# Patient Record
Sex: Male | Born: 1955
Health system: Southern US, Community
[De-identification: ages and names within clinical notes are randomized; demographics above are authoritative.]

## PROBLEM LIST (undated history)

## (undated) DIAGNOSIS — F32A Depression, unspecified: Secondary | ICD-10-CM

## (undated) DIAGNOSIS — E039 Hypothyroidism, unspecified: Secondary | ICD-10-CM

## (undated) DIAGNOSIS — E785 Hyperlipidemia, unspecified: Secondary | ICD-10-CM

## (undated) DIAGNOSIS — F329 Major depressive disorder, single episode, unspecified: Secondary | ICD-10-CM

## (undated) DIAGNOSIS — C73 Malignant neoplasm of thyroid gland: Secondary | ICD-10-CM

## (undated) DIAGNOSIS — I82629 Acute embolism and thrombosis of deep veins of unspecified upper extremity: Secondary | ICD-10-CM

## (undated) DIAGNOSIS — J301 Allergic rhinitis due to pollen: Secondary | ICD-10-CM

## (undated) HISTORY — DX: Hypothyroidism, unspecified: E03.9

## (undated) HISTORY — DX: Malignant neoplasm of thyroid gland: C73

## (undated) HISTORY — DX: Hyperlipidemia, unspecified: E78.5

## (undated) HISTORY — PX: THYROIDECTOMY: SHX17

## (undated) HISTORY — DX: Acute embolism and thrombosis of deep veins of unspecified upper extremity: I82.629

## (undated) HISTORY — DX: Allergic rhinitis due to pollen: J30.1

## (undated) HISTORY — DX: Depression, unspecified: F32.A

## (undated) HISTORY — PX: COLONOSCOPY: SHX174

## (undated) HISTORY — DX: Major depressive disorder, single episode, unspecified: F32.9

---

## 1998-01-09 ENCOUNTER — Ambulatory Visit (HOSPITAL_COMMUNITY): Admission: RE | Admit: 1998-01-09 | Discharge: 1998-01-09 | Payer: Self-pay | Admitting: Family Medicine

## 1998-01-31 ENCOUNTER — Other Ambulatory Visit: Admission: RE | Admit: 1998-01-31 | Discharge: 1998-01-31 | Payer: Self-pay | Admitting: *Deleted

## 1998-03-28 ENCOUNTER — Encounter: Payer: Self-pay | Admitting: Surgery

## 1998-03-29 ENCOUNTER — Ambulatory Visit (HOSPITAL_COMMUNITY): Admission: RE | Admit: 1998-03-29 | Discharge: 1998-03-30 | Payer: Self-pay | Admitting: Surgery

## 1998-05-03 ENCOUNTER — Ambulatory Visit (HOSPITAL_COMMUNITY): Admission: RE | Admit: 1998-05-03 | Discharge: 1998-05-04 | Payer: Self-pay | Admitting: Surgery

## 1998-05-25 ENCOUNTER — Ambulatory Visit (HOSPITAL_COMMUNITY): Admission: RE | Admit: 1998-05-25 | Discharge: 1998-05-25 | Payer: Self-pay | Admitting: *Deleted

## 1998-05-31 ENCOUNTER — Ambulatory Visit (HOSPITAL_COMMUNITY): Admission: RE | Admit: 1998-05-31 | Discharge: 1998-05-31 | Payer: Self-pay | Admitting: *Deleted

## 1999-05-13 ENCOUNTER — Encounter (HOSPITAL_COMMUNITY): Admission: RE | Admit: 1999-05-13 | Discharge: 1999-08-11 | Payer: Self-pay | Admitting: *Deleted

## 2005-05-28 ENCOUNTER — Encounter: Payer: Self-pay | Admitting: Family Medicine

## 2006-06-29 ENCOUNTER — Ambulatory Visit: Payer: Self-pay | Admitting: Gastroenterology

## 2006-07-15 ENCOUNTER — Ambulatory Visit: Payer: Self-pay | Admitting: Gastroenterology

## 2007-12-02 ENCOUNTER — Encounter: Payer: Self-pay | Admitting: Family Medicine

## 2008-06-27 ENCOUNTER — Ambulatory Visit: Payer: Self-pay | Admitting: Family Medicine

## 2008-06-27 DIAGNOSIS — E785 Hyperlipidemia, unspecified: Secondary | ICD-10-CM

## 2008-06-27 DIAGNOSIS — E039 Hypothyroidism, unspecified: Secondary | ICD-10-CM

## 2008-06-27 DIAGNOSIS — F329 Major depressive disorder, single episode, unspecified: Secondary | ICD-10-CM

## 2008-06-28 LAB — CONVERTED CEMR LAB
ALT: 21 units/L (ref 0–53)
AST: 21 units/L (ref 0–37)
Albumin: 4.6 g/dL (ref 3.5–5.2)
Alkaline Phosphatase: 61 units/L (ref 39–117)
Bilirubin, Direct: 0.1 mg/dL (ref 0.0–0.3)
Cholesterol: 167 mg/dL (ref 0–200)
HDL: 54 mg/dL (ref >39.00)
LDL Cholesterol: 98 mg/dL (ref 0–99)
TSH: 0.76 microintl units/mL (ref 0.35–5.50)
Total Bilirubin: 1.1 mg/dL (ref 0.3–1.2)
Total CHOL/HDL Ratio: 3
Total Protein: 7.8 g/dL (ref 6.0–8.3)
Triglycerides: 76 mg/dL (ref 0.0–149.0)
VLDL: 15.2 mg/dL (ref 0.0–40.0)

## 2009-03-27 DIAGNOSIS — I82629 Acute embolism and thrombosis of deep veins of unspecified upper extremity: Secondary | ICD-10-CM

## 2009-03-27 HISTORY — DX: Acute embolism and thrombosis of deep veins of unspecified upper extremity: I82.629

## 2009-04-24 ENCOUNTER — Ambulatory Visit: Payer: Self-pay

## 2009-04-24 ENCOUNTER — Ambulatory Visit: Payer: Self-pay | Admitting: Family Medicine

## 2009-04-24 DIAGNOSIS — N529 Male erectile dysfunction, unspecified: Secondary | ICD-10-CM

## 2009-04-24 DIAGNOSIS — I82409 Acute embolism and thrombosis of unspecified deep veins of unspecified lower extremity: Secondary | ICD-10-CM | POA: Insufficient documentation

## 2009-04-24 DIAGNOSIS — M7989 Other specified soft tissue disorders: Secondary | ICD-10-CM

## 2009-04-27 ENCOUNTER — Ambulatory Visit: Payer: Self-pay | Admitting: Family Medicine

## 2009-04-27 LAB — CONVERTED CEMR LAB
INR: 1.3
Prothrombin Time: 14 s

## 2009-04-30 ENCOUNTER — Ambulatory Visit: Payer: Self-pay | Admitting: Family Medicine

## 2009-04-30 LAB — CONVERTED CEMR LAB
ALT: 20 units/L (ref 0–53)
Basophils Absolute: 0.1 10*3/uL (ref 0.0–0.1)
Basophils Relative: 1.1 % (ref 0.0–3.0)
Calcium: 9.5 mg/dL (ref 8.4–10.5)
Cholesterol: 178 mg/dL (ref 0–200)
Eosinophils Relative: 2.4 % (ref 0.0–5.0)
GFR calc non Af Amer: 106.53 mL/min (ref >60)
HCT: 43.5 % (ref 39.0–52.0)
HDL: 49.1 mg/dL (ref >39.00)
Hemoglobin: 15.1 g/dL (ref 13.0–17.0)
LDL Cholesterol: 108 mg/dL — ABNORMAL HIGH (ref 0–99)
Lymphocytes Relative: 38.3 % (ref 12.0–46.0)
Monocytes Relative: 9.5 % (ref 3.0–12.0)
Neutro Abs: 3.3 10*3/uL (ref 1.4–7.7)
Potassium: 4.6 meq/L (ref 3.5–5.1)
RBC: 4.53 M/uL (ref 4.22–5.81)
Sodium: 137 meq/L (ref 135–145)
TSH: 1.51 microintl units/mL (ref 0.35–5.50)
Total Bilirubin: 0.7 mg/dL (ref 0.3–1.2)
Total Protein: 7.7 g/dL (ref 6.0–8.3)
VLDL: 21.2 mg/dL (ref 0.0–40.0)
WBC: 6.7 10*3/uL (ref 4.5–10.5)

## 2009-05-02 ENCOUNTER — Ambulatory Visit: Payer: Self-pay | Admitting: Family Medicine

## 2009-05-02 LAB — CONVERTED CEMR LAB
INR: 2.5
Prothrombin Time: 19.3 s

## 2009-05-16 ENCOUNTER — Ambulatory Visit: Payer: Self-pay | Admitting: Family Medicine

## 2009-05-16 LAB — CONVERTED CEMR LAB
INR: 3.3
Prothrombin Time: 21.8 s

## 2009-05-30 ENCOUNTER — Ambulatory Visit: Payer: Self-pay | Admitting: Family Medicine

## 2009-05-30 LAB — CONVERTED CEMR LAB: Prothrombin Time: 17.9 s

## 2009-06-18 ENCOUNTER — Telehealth: Payer: Self-pay | Admitting: Family Medicine

## 2009-06-27 ENCOUNTER — Ambulatory Visit: Payer: Self-pay | Admitting: Family Medicine

## 2009-07-25 ENCOUNTER — Ambulatory Visit: Payer: Self-pay | Admitting: Family Medicine

## 2009-08-22 ENCOUNTER — Ambulatory Visit: Payer: Self-pay | Admitting: Family Medicine

## 2009-08-22 LAB — CONVERTED CEMR LAB: INR: 2.3

## 2009-09-06 ENCOUNTER — Telehealth: Payer: Self-pay | Admitting: Family Medicine

## 2009-09-26 ENCOUNTER — Ambulatory Visit: Payer: Self-pay | Admitting: Family Medicine

## 2009-10-10 ENCOUNTER — Ambulatory Visit: Payer: Self-pay | Admitting: Family Medicine

## 2009-11-07 ENCOUNTER — Ambulatory Visit: Payer: Self-pay | Admitting: Family Medicine

## 2009-11-07 LAB — CONVERTED CEMR LAB: INR: 2.3

## 2009-12-05 ENCOUNTER — Ambulatory Visit: Payer: Self-pay | Admitting: Family Medicine

## 2009-12-11 ENCOUNTER — Encounter: Payer: Self-pay | Admitting: Family Medicine

## 2009-12-12 ENCOUNTER — Encounter: Payer: Self-pay | Admitting: Family Medicine

## 2009-12-12 ENCOUNTER — Ambulatory Visit: Payer: Self-pay

## 2009-12-17 ENCOUNTER — Telehealth: Payer: Self-pay | Admitting: Family Medicine

## 2010-01-03 ENCOUNTER — Ambulatory Visit: Payer: Self-pay | Admitting: Family Medicine

## 2010-01-03 DIAGNOSIS — L919 Hypertrophic disorder of the skin, unspecified: Secondary | ICD-10-CM

## 2010-01-03 DIAGNOSIS — L851 Acquired keratosis [keratoderma] palmaris et plantaris: Secondary | ICD-10-CM

## 2010-01-03 DIAGNOSIS — L909 Atrophic disorder of skin, unspecified: Secondary | ICD-10-CM | POA: Insufficient documentation

## 2010-01-09 LAB — CONVERTED CEMR LAB
AST: 19 units/L (ref 0–37)
Alkaline Phosphatase: 60 units/L (ref 39–117)
Basophils Absolute: 0 10*3/uL (ref 0.0–0.1)
Calcium: 9.7 mg/dL (ref 8.4–10.5)
Cholesterol: 200 mg/dL (ref 0–200)
GFR calc non Af Amer: 107.81 mL/min (ref >60.00)
Glucose, Bld: 83 mg/dL (ref 70–99)
HDL: 53.3 mg/dL (ref >39.00)
Hemoglobin: 14.7 g/dL (ref 13.0–17.0)
LDL Cholesterol: 122 mg/dL — ABNORMAL HIGH (ref 0–99)
Lymphocytes Relative: 28 % (ref 12.0–46.0)
Monocytes Relative: 6.8 % (ref 3.0–12.0)
Neutro Abs: 5.3 10*3/uL (ref 1.4–7.7)
Platelets: 256 10*3/uL (ref 150.0–400.0)
RDW: 13.2 % (ref 11.5–14.6)
Sodium: 139 meq/L (ref 135–145)
Total Bilirubin: 0.8 mg/dL (ref 0.3–1.2)
Triglycerides: 124 mg/dL (ref 0.0–149.0)
VLDL: 24.8 mg/dL (ref 0.0–40.0)

## 2010-01-22 ENCOUNTER — Telehealth: Payer: Self-pay | Admitting: Family Medicine

## 2010-02-09 ENCOUNTER — Encounter: Payer: Self-pay | Admitting: Family Medicine

## 2010-02-09 DIAGNOSIS — I82409 Acute embolism and thrombosis of unspecified deep veins of unspecified lower extremity: Secondary | ICD-10-CM

## 2010-02-24 LAB — CONVERTED CEMR LAB
Cholesterol, target level: 200 mg/dL
HDL goal, serum: 40 mg/dL
LDL Goal: 160 mg/dL

## 2010-02-26 NOTE — Assessment & Plan Note (Signed)
Summary: L ARM SWELLING / CONTUSION // RS   Vital Signs:  Patient profile:   55 year old male Weight:      182 pounds BMI:     25.84 Temp:     98.8 degrees F oral BP sitting:   122 / 84  (right arm) Cuff size:   regular  Vitals Entered By: Sid Falcon LPN (April 24, 2009 9:55 AM) CC: left arm swelling noted yesterday, , Lipid Management   History of Present Illness: Acute 1 day hx of edema LUE with some color changes.  No significant arm pain.   No specific injury but did start some upper body weight lifting about one month ago.  No chest pain, pleuritic pain, dyspnea, or hemoptysis.  No hx of thrombosis or known FH of coagulopathy.  Nonsmoker.  Remote hx thyroid cancer.  No recent appetite or weight change.  Edema worse with dependent position.  No recent or remote hx of any bleeding problems.  Couple of weeks ago noted some "bruising" L lateral arm and after pointing out this area has some superficial dilated varicosities which are recently of prominence but no visible bruising.  Other issue is erectile dysfuntion. Has used Cialis in past with good success and requests refills.  No hx CAD and no nitroglycerin use.  Other chronic problems include hx hyperlipidemia and hypothroidism.  Compliant with meds  Lipid Management History:      Positive NCEP/ATP III risk factors include male age 6 years old or older.  Negative NCEP/ATP III risk factors include non-diabetic, no family history for ischemic heart disease, non-tobacco-user status, non-hypertensive, no ASHD (atherosclerotic heart disease), no prior stroke/TIA, no peripheral vascular disease, and no history of aortic aneurysm.     Preventive Screening-Counseling & Management  Alcohol-Tobacco     Smoking Status: quit  Allergies: 1)  ! Vytorin (Ezetimibe-Simvastatin)  Past History:  Past Medical History: Last updated: 06/27/2008 Thyroid cancer Depression Hay fever/allergies  Past Surgical History: Last updated:  06/27/2008 Thyroid removal 2000  Family History: Last updated: 06/27/2008 colon cancer, blood relative Breast cancer, grandparent Family History High cholesterol, parent, blood relative Heart disease, blood relative Family History Hypertension, parent Stroke, parent Diabetes, parent  Social History: Last updated: 06/27/2008 Retired:  Theatre stage manager Married Smoker, quit 1987 PMH-FH-SH reviewed for relevance  Social History: Smoking Status:  quit  Review of Systems  The patient denies fever, weight loss, weight gain, chest pain, syncope, dyspnea on exertion, prolonged cough, hemoptysis, abdominal pain, anorexia, hoarseness, headaches, melena, hematochezia, severe indigestion/heartburn, hematuria, and muscle weakness.    Physical Exam  General:  Well-developed,well-nourished,in no acute distress; alert,appropriate and cooperative throughout examination Head:  Normocephalic and atraumatic without obvious abnormalities. No apparent alopecia or balding. Neck:  No deformities, masses, or tenderness noted. Lungs:  Normal respiratory effort, chest expands symmetrically. Lungs are clear to auscultation, no crackles or wheezes. Heart:  Normal rate and regular rhythm. S1 and S2 normal without gallop, murmur, click, rub or other extra sounds. Pulses:  R radial normal and L radial normal.   Extremities:  Diffuse swelling LUE with some mild bluish color changes c/w RUE.  Normal pulses and nontender.  No axillary masses palpated.   Neurologic:  No cranial nerve deficits noted. Station and gait are normal. Plantar reflexes are down-going bilaterally. DTRs are symmetrical throughout. Sensory, motor and coordinative functions appear intact. Skin:  color changes LUE vs RUE as noted otherwise no signif abnormality. Cervical Nodes:  No lymphadenopathy noted Axillary Nodes:  No palpable  lymphadenopathy Psych:  normally interactive, good eye contact, not anxious appearing, and not depressed  appearing.     Impression & Recommendations:  Problem # 1:  SWELLING OF LIMB (ICD-729.81) Assessment New  Suspicion high for thrombosis, prob subclavian vein.  Doppler now and go from there. Ultrsound shows thrombosis Prox L subclavian vein with some difficulty determining duration of clot (pt returned to office for further discussio n and management after receiving results). Long discussion with pt regarding options including interventional radiology for consideration of thrombolysis vs conservative route of anticoagulation alone and he prefers the latter which seems reasonable given his lack of pain and ?duration of clot.  Pt instructed in dosing of Lovenox and first dose of 80 mg (1mg /kg for outpatient DVT) given in office and pt tolerated well.   Lovenox 80 mg [place] q 12 hours and initiate Coumadin 5 mg daily and office f/u with INR in 3 days. Over 45 minutes spent with pt assessing, discussing options, and educating regarding anticoagulation use.  Orders: Admin of Therapeutic Inj  intramuscular or subcutaneous (69629)  Problem # 2:  IMPOTENCE OF ORGANIC ORIGIN (ICD-607.84) refill cialis. His updated medication list for this problem includes:    Cialis 20 Mg Tabs (Tadalafil) ..... One by mouth every other day as needed  Problem # 3:  HYPOTHYROIDISM, PRIMARY (ICD-244.9)  His updated medication list for this problem includes:    Synthroid 112 Mcg Tabs (Levothyroxine sodium) ..... Once daily    Liothyronine Sodium 25 Mcg Tabs (Liothyronine sodium) ..... One half daily  Problem # 4:  HYPERLIPIDEMIA (ICD-272.4)  His updated medication list for this problem includes:    Crestor 5 Mg Tabs (Rosuvastatin calcium) ..... Once daily  Complete Medication List: 1)  Sertraline Hcl 50 Mg Tabs (Sertraline hcl) .... One daily 2)  Synthroid 112 Mcg Tabs (Levothyroxine sodium) .... Once daily 3)  Crestor 5 Mg Tabs (Rosuvastatin calcium) .... Once daily 4)  Liothyronine Sodium 25 Mcg Tabs  (Liothyronine sodium) .... One half daily 5)  Cialis 20 Mg Tabs (Tadalafil) .... One by mouth every other day as needed 6)  Warfarin Sodium 5 Mg Tabs (Warfarin sodium) .... One by mouth once daily 7)  Lovenox 80 Mg/0.34ml Soln (Enoxaparin sodium) .... 0.8 ml [place] q 12 hours  Other Orders: Lovenox 10mg  Inj. (B2841)  Lipid Assessment/Plan:      Based on NCEP/ATP III, the patient's risk factor category is "0-1 risk factors".  The patient's lipid goals are as follows: Total cholesterol goal is 200; LDL cholesterol goal is 160; HDL cholesterol goal is 40; Triglyceride goal is 150.    Patient Instructions: 1)  Follow up immediately if you notice any shortness of breath, chest pain, or any other problems. 2)  Start Lovenox 80 mg (one prefilled syringe) every 12 hours 3)  Start Coumadin (warfarin) 5 mg one daily. 4)  Follow up in 3 days for folllow up and labs. 5)  NO weight lifting or any strenuos exercise until follow up. Prescriptions: LOVENOX 80 MG/0.8ML SOLN (ENOXAPARIN SODIUM) 0.8 ml  q 12 hours  #10 x 0   Entered and Authorized by:   Evelena Peat MD   Signed by:   Evelena Peat MD on 04/24/2009   Method used:   Electronically to        [facility] [phone]* (retail)       [address]       [place], [place]  [address]       Ph: [phone] or [phone]       Fax: [phone]   RxID:   [id number] WARFARIN SODIUM 5 MG TABS (WARFARIN SODIUM) one by mouth once daily  #30 x 1   Entered and Authorized by:   Evelena Peat MD   Signed by:   Evelena Peat MD on 04/24/2009   Method used:   Electronically to        [facility] [phone]* (retail)       [address]       [place], [place]  [address]       Ph: [phone] or [phone]       Fax: [phone]   RxID:   [id number] CIALIS 20 MG TABS (TADALAFIL) one by mouth every other day as needed  #6 x 11   Entered and Authorized by:   Evelena Peat MD    Signed by:   Evelena Peat MD on 04/24/2009   Method used:   Electronically to        [facility] [phone]* (retail)       [address]       [place], [place]  [address]       Ph: [phone] or [phone]       Fax: [phone]   RxID:   [id number]    Medication Administration  Injection # 1:    Medication: Lovenox 10mg  Inj.    Diagnosis: DEEP VENOUS THROMBOPHLEBITIS (ICD-453.40)    Route: SQ    Site: (R) abd    Exp Date: 08/2011    Lot #: [id number]    Mfr: Sanofi Pasteur    Comments: gave 80mg     Patient tolerated injection without complications    Given by: Duard Brady LPN (April 24, 2009 2:53 PM)  Orders Added: 1)  Est. Patient Level V [67124] 2)  Admin of Therapeutic Inj  intramuscular or subcutaneous [96372] 3)  Lovenox 10mg  Inj. [J1650]

## 2010-02-26 NOTE — Assessment & Plan Note (Signed)
Summary: pt//ccm   Nurse Visit   Allergies: 1)  ! Vytorin (Ezetimibe-Simvastatin) Laboratory Results   Blood Tests   Date/Time Received: November 07, 2009 11:05 AM  Date/Time Reported: November 07, 2009 11:05 AM    INR: 2.3   (Normal Range: 0.88-1.12   Therap INR: 2.0-3.5) Comments: Wynona Canes, CMA  November 07, 2009 11:05 AM     Orders Added: 1)  Est. Patient Level I [99211] 2)  Protime [16109UE]  Laboratory Results   Blood Tests      INR: 2.3   (Normal Range: 0.88-1.12   Therap INR: 2.0-3.5) Comments: Wynona Canes, CMA  November 07, 2009 11:05 AM       ANTICOAGULATION RECORD PREVIOUS REGIMEN & LAB RESULTS Anticoagulation Diagnosis:  v58.61,v58.83,453.41 on  04/27/2009 Previous INR Goal Range:  2.0-3.0 on  04/27/2009 Previous INR:  3.1 on  10/10/2009 Previous Coumadin Dose(mg):  5mg  on m,w,f 2.5mg  on other days on  06/27/2009 Previous Regimen:  same on  10/10/2009 Previous Coagulation Comments:  Dr Kirtland Bouchard approved on  05/16/2009  NEW REGIMEN & LAB RESULTS Current INR: 2.3 Current Coumadin Dose(mg): 2.5mg  on tue,thu, & sun. 5mg  on other days Regimen: same  (no change)       Repeat testing in: 4 weeks MEDICATIONS SERTRALINE HCL 50 MG TABS (SERTRALINE HCL) one daily SYNTHROID 112 MCG TABS (LEVOTHYROXINE SODIUM) once daily CRESTOR 5 MG TABS (ROSUVASTATIN CALCIUM) once daily LIOTHYRONINE SODIUM 25 MCG TABS (LIOTHYRONINE SODIUM) one half daily CIALIS 20 MG TABS (TADALAFIL) one by mouth every other day as needed WARFARIN SODIUM 5 MG TABS (WARFARIN SODIUM) one by mouth once daily LOVENOX 80 MG/0.8ML SOLN (ENOXAPARIN SODIUM) 0.8 ml [place] q 12 hours HYDROCODONE-ACETAMINOPHEN 5-325 MG TABS (HYDROCODONE-ACETAMINOPHEN) 1-2 by mouth q 4-6 hours as needed pain DESONIDE 0.05 % CREA (DESONIDE) apply to affected area bid   Anticoagulation Visit Questionnaire      Coumadin dose missed/changed:  No      Abnormal Bleeding Symptoms:  No   Any diet changes including  alcohol intake, vegetables or greens since the last visit:  No Any illnesses or hospitalizations since the last visit:  No Any signs of clotting since the last visit (including chest discomfort, dizziness, shortness of breath, arm tingling, slurred speech, swelling or redness in leg):  No

## 2010-02-26 NOTE — Assessment & Plan Note (Signed)
Summary: pt/njr   Nurse Visit   Allergies: 1)  ! Vytorin (Ezetimibe-Simvastatin) Laboratory Results   Blood Tests      INR: 1.9   (Normal Range: 0.88-1.12   Therap INR: 2.0-3.5) Comments: Rita Ohara  July 25, 2009 11:45 AM     Orders Added: 1)  Est. Patient Level I [99211] 2)  Protime [16109UE]   ANTICOAGULATION RECORD PREVIOUS REGIMEN & LAB RESULTS Anticoagulation Diagnosis:  v58.61,v58.83,453.41 on  04/27/2009 Previous INR Goal Range:  2.0-3.0 on  04/27/2009 Previous INR:  1.9 on  06/27/2009 Previous Coumadin Dose(mg):  5mg  on m,w,f 2.5mg  on other days on  06/27/2009 Previous Regimen:  same on  05/30/2009 Previous Coagulation Comments:  Dr Kirtland Bouchard approved on  05/16/2009  NEW REGIMEN & LAB RESULTS Current INR: 1.9 Regimen: same  Repeat testing in: 4 weeks  Anticoagulation Visit Questionnaire Coumadin dose missed/changed:  No Abnormal Bleeding Symptoms:  No  Any diet changes including alcohol intake, vegetables or greens since the last visit:  No Any illnesses or hospitalizations since the last visit:  No Any signs of clotting since the last visit (including chest discomfort, dizziness, shortness of breath, arm tingling, slurred speech, swelling or redness in leg):  No  MEDICATIONS SERTRALINE HCL 50 MG TABS (SERTRALINE HCL) one daily SYNTHROID 112 MCG TABS (LEVOTHYROXINE SODIUM) once daily CRESTOR 5 MG TABS (ROSUVASTATIN CALCIUM) once daily LIOTHYRONINE SODIUM 25 MCG TABS (LIOTHYRONINE SODIUM) one half daily CIALIS 20 MG TABS (TADALAFIL) one by mouth every other day as needed WARFARIN SODIUM 5 MG TABS (WARFARIN SODIUM) one by mouth once daily LOVENOX 80 MG/0.8ML SOLN (ENOXAPARIN SODIUM) 0.8 ml [place] q 12 hours

## 2010-02-26 NOTE — Assessment & Plan Note (Signed)
Summary: 3 day follow up/pt coming in fasting/cjr   Vital Signs:  Patient profile:   55 year old male Temp:     98 degrees F oral BP sitting:   120 / 86  (left arm) Cuff size:   regular  Vitals Entered By: Sid Falcon LPN (April 28, 8467 10:05 AM) CC: 3 day follow-up left arm , Lipid Management   History of Present Illness: Patient here for followup deep vein thrombosis left upper extremity subclavian vein.  Initiated Lovenox 80 mg subcutaneous twice daily and Coumadin. He has had 3 doses of Coumadin. Administering Lovenox without difficulty. No significant left upper extremity pain. Denies any dyspnea, chest pains, pleuritic pain, or hemoptysis. No side effects from medication. No bruising. Question of recent injury left upper extremity with lifting. No prior history of DVT. No clear family history of coagulopathy.  Left upper extremity swelling slightly diminished since yesterday  Patient has other chronic problems including hypothyroidism, hyperlipidemia, erectile dysfunction. Request other lab work today. Also needs CBC and INR.  Compliant with thyroid medications.  No symptoms of hypothyroidism.    Lipid Management History:      Positive NCEP/ATP III risk factors include male age 53 years old or older.  Negative NCEP/ATP III risk factors include non-diabetic, no family history for ischemic heart disease, non-tobacco-user status, non-hypertensive, no ASHD (atherosclerotic heart disease), no prior stroke/TIA, no peripheral vascular disease, and no history of aortic aneurysm.     Allergies: 1)  ! Vytorin (Ezetimibe-Simvastatin)  Past History:  Past Medical History: Last updated: 06/27/2008 Thyroid cancer Depression Hay fever/allergies  Social History: Last updated: 06/27/2008 Retired:  Theatre stage manager Married Smoker, quit 1987 PMH reviewed for relevance, SH/Risk Factors reviewed for relevance  Review of Systems  The patient denies fever, weight loss, chest pain,  syncope, dyspnea on exertion, prolonged cough, headaches, hemoptysis, abdominal pain, melena, hematochezia, and hematuria.    Physical Exam  General:  Well-developed,well-nourished,in no acute distress; alert,appropriate and cooperative throughout examination Neck:  No deformities, masses, or tenderness noted. Lungs:  Normal respiratory effort, chest expands symmetrically. Lungs are clear to auscultation, no crackles or wheezes. Heart:  Normal rate and regular rhythm. S1 and S2 normal without gallop, murmur, click, rub or other extra sounds. Extremities:  LUE edema slightly improved c/w 3 days ago.  Good distal pulses. Neurologic:  strength normal in all extremities.   Psych:  normally interactive, good eye contact, not anxious appearing, and not depressed appearing.     Impression & Recommendations:  Problem # 1:  DEEP VENOUS THROMBOPHLEBITIS (ICD-453.40) clinically stable.  INR subtherapeutic as expected on day 3 coumadin.  Cont lovenox and coumadin and recheck INR on Monday.  Problem # 2:  HYPOTHYROIDISM, PRIMARY (ICD-244.9) Assessment: Unchanged  His updated medication list for this problem includes:    Synthroid 112 Mcg Tabs (Levothyroxine sodium) ..... Once daily    Liothyronine Sodium 25 Mcg Tabs (Liothyronine sodium) ..... One half daily  Orders: Venipuncture (62952) TLB-TSH (Thyroid Stimulating Hormone) (84443-TSH)  Problem # 3:  HYPERLIPIDEMIA (ICD-272.4) Assessment: Unchanged  His updated medication list for this problem includes:    Crestor 5 Mg Tabs (Rosuvastatin calcium) ..... Once daily  Orders: Venipuncture (84132) TLB-Lipid Panel (80061-LIPID) TLB-Hepatic/Liver Function Pnl (80076-HEPATIC)  Complete Medication List: 1)  Sertraline Hcl 50 Mg Tabs (Sertraline hcl) .... One daily 2)  Synthroid 112 Mcg Tabs (Levothyroxine sodium) .... Once daily 3)  Crestor 5 Mg Tabs (Rosuvastatin calcium) .... Once daily 4)  Liothyronine Sodium 25 Mcg Tabs (  Liothyronine  sodium) .... One half daily 5)  Cialis 20 Mg Tabs (Tadalafil) .... One by mouth every other day as needed 6)  Warfarin Sodium 5 Mg Tabs (Warfarin sodium) .... One by mouth once daily 7)  Lovenox 80 Mg/0.71ml Soln (Enoxaparin sodium) .... 0.8 ml [place] q 12 hours  Other Orders: Protime (16109UE) TLB-BMP (Basic Metabolic Panel-BMET) (80048-METABOL) TLB-CBC Platelet - w/Differential (85025-CBCD)  Lipid Assessment/Plan:      Based on NCEP/ATP III, the patient's risk factor category is "0-1 risk factors".  The patient's lipid goals are as follows: Total cholesterol goal is 200; LDL cholesterol goal is 160; HDL cholesterol goal is 40; Triglyceride goal is 150.    Patient Instructions: 1)  Schedule follow up for this Monday. 2)  Continue Lovenox through the weekend 3)  Increase Coumadin to 7.5 mg on Fridays only and 5 mg all other days and we will recheck INR on Monday.   ANTICOAGULATION RECORD       NEW REGIMEN & LAB RESULTS Anticoag. Dx: v58.61,v58.83,453.41 Current INR Goal Range: 2.0-3.0 Current INR: 1.3 Current Coumadin Dose(mg): 5mg  qd for only 3 days Regimen: 7.5mg  only on fri then 5mg  other days       Repeat testing in: 3 weeks MEDICATIONS SERTRALINE HCL 50 MG TABS (SERTRALINE HCL) one daily SYNTHROID 112 MCG TABS (LEVOTHYROXINE SODIUM) once daily CRESTOR 5 MG TABS (ROSUVASTATIN CALCIUM) once daily LIOTHYRONINE SODIUM 25 MCG TABS (LIOTHYRONINE SODIUM) one half daily CIALIS 20 MG TABS (TADALAFIL) one by mouth every other day as needed WARFARIN SODIUM 5 MG TABS (WARFARIN SODIUM) one by mouth once daily LOVENOX 80 MG/0.8ML SOLN (ENOXAPARIN SODIUM) 0.8 ml [place] q 12 hours   Anticoagulation Visit Questionnaire      Coumadin dose missed/changed:  No      Abnormal Bleeding Symptoms:  No   Any diet changes including alcohol intake, vegetables or greens since the last visit:  No Any illnesses or hospitalizations since the last visit:  No Any signs of clotting since the last visit  (including chest discomfort, dizziness, shortness of breath, arm tingling, slurred speech, swelling or redness in leg):  No   Laboratory Results   Blood Tests   Date/Time Recieved: April 27, 2009 11:53 AM  Date/Time Reported: April 27, 2009 11:53 AM   PT: 14.0 s   (Normal Range: 10.6-13.4)  INR: 1.3   (Normal Range: 0.88-1.12   Therap INR: 2.0-3.5) Comments: Wynona Canes, CMA  April 27, 2009 11:53 AM

## 2010-02-26 NOTE — Assessment & Plan Note (Signed)
Summary: pt/njr   Nurse Visit   Allergies: 1)  ! Vytorin (Ezetimibe-Simvastatin) Laboratory Results   Blood Tests   Date/Time Received: April 30, 2009 10:55 AM  Date/Time Reported: April 30, 2009 10:55 AM   PT: 20.5 s   (Normal Range: 10.6-13.4)  INR: 2.9   (Normal Range: 0.88-1.12   Therap INR: 2.0-3.5) Comments: Wynona Canes, CMA  April 30, 2009 10:55 AM D/C lovenox and coumadin adjusted and recheck in 3 days. Evelena Peat MD  April 30, 2009 11:58 AM      Orders Added: 1)  Est. Patient Level I [99211] 2)  Protime [16109UE]  Laboratory Results   Blood Tests     PT: 20.5 s   (Normal Range: 10.6-13.4)  INR: 2.9   (Normal Range: 0.88-1.12   Therap INR: 2.0-3.5) Comments: Wynona Canes, CMA  April 30, 2009 10:55 AM D/C lovenox and coumadin adjusted and recheck in 3 days. Evelena Peat MD  April 30, 2009 11:58 AM        ANTICOAGULATION RECORD PREVIOUS REGIMEN & LAB RESULTS Anticoagulation Diagnosis:  v58.61,v58.83,453.41 on  04/27/2009 Previous INR Goal Range:  2.0-3.0 on  04/27/2009 Previous INR:  1.3 on  04/27/2009 Previous Coumadin Dose(mg):  5mg  qd for only 3 days on  04/27/2009 Previous Regimen:  7.5mg  only on fri then 5mg  other days on  04/27/2009  NEW REGIMEN & LAB RESULTS Current INR: 2.9 Regimen: 2.5mg  on m,w,f 5mg  on other days       Repeat testing in: 3 days MEDICATIONS SERTRALINE HCL 50 MG TABS (SERTRALINE HCL) one daily SYNTHROID 112 MCG TABS (LEVOTHYROXINE SODIUM) once daily CRESTOR 5 MG TABS (ROSUVASTATIN CALCIUM) once daily LIOTHYRONINE SODIUM 25 MCG TABS (LIOTHYRONINE SODIUM) one half daily CIALIS 20 MG TABS (TADALAFIL) one by mouth every other day as needed WARFARIN SODIUM 5 MG TABS (WARFARIN SODIUM) one by mouth once daily LOVENOX 80 MG/0.8ML SOLN (ENOXAPARIN SODIUM) 0.8 ml [place] q 12 hours   Anticoagulation Visit Questionnaire      Coumadin dose missed/changed:  No      Abnormal Bleeding Symptoms:  No   Any diet  changes including alcohol intake, vegetables or greens since the last visit:  No Any illnesses or hospitalizations since the last visit:  No Any signs of clotting since the last visit (including chest discomfort, dizziness, shortness of breath, arm tingling, slurred speech, swelling or redness in leg):  No

## 2010-02-26 NOTE — Assessment & Plan Note (Signed)
Summary: pt/njr   Nurse Visit   Allergies: 1)  ! Vytorin (Ezetimibe-Simvastatin) Laboratory Results   Blood Tests     PT: 17.9 s   (Normal Range: 10.6-13.4)  INR: 2.1   (Normal Range: 0.88-1.12   Therap INR: 2.0-3.5) Comments: Rita Ohara  May 30, 2009 11:32 AM     Orders Added: 1)  Est. Patient Level I [99211] 2)  Protime [16109UE]    ANTICOAGULATION RECORD PREVIOUS REGIMEN & LAB RESULTS Anticoagulation Diagnosis:  v58.61,v58.83,453.41 on  04/27/2009 Previous INR Goal Range:  2.0-3.0 on  04/27/2009 Previous INR:  3.3 on  05/16/2009 Previous Coumadin Dose(mg):  5mg  qd for only 3 days on  04/27/2009 Previous Regimen:  5mg . M, W, F all other days 2.5mg . on  05/16/2009 Previous Coagulation Comments:  Dr Kirtland Bouchard approved on  05/16/2009  NEW REGIMEN & LAB RESULTS Current INR: 2.1 Regimen: same  Repeat testing in: 1 month  Anticoagulation Visit Questionnaire Coumadin dose missed/changed:  No Abnormal Bleeding Symptoms:  No  Any diet changes including alcohol intake, vegetables or greens since the last visit:  No Any illnesses or hospitalizations since the last visit:  No Any signs of clotting since the last visit (including chest discomfort, dizziness, shortness of breath, arm tingling, slurred speech, swelling or redness in leg):  No  MEDICATIONS SERTRALINE HCL 50 MG TABS (SERTRALINE HCL) one daily SYNTHROID 112 MCG TABS (LEVOTHYROXINE SODIUM) once daily CRESTOR 5 MG TABS (ROSUVASTATIN CALCIUM) once daily LIOTHYRONINE SODIUM 25 MCG TABS (LIOTHYRONINE SODIUM) one half daily CIALIS 20 MG TABS (TADALAFIL) one by mouth every other day as needed WARFARIN SODIUM 5 MG TABS (WARFARIN SODIUM) one by mouth once daily LOVENOX 80 MG/0.8ML SOLN (ENOXAPARIN SODIUM) 0.8 ml [place] q 12 hours

## 2010-02-26 NOTE — Assessment & Plan Note (Signed)
Summary: PT/NJR   Nurse Visit   Allergies: 1)  ! Vytorin (Ezetimibe-Simvastatin) Laboratory Results   Blood Tests     PT: 21.8 s   (Normal Range: 10.6-13.4)  INR: 3.3   (Normal Range: 0.88-1.12   Therap INR: 2.0-3.5) Comments: Rita Ohara  May 16, 2009 12:13 PM     Orders Added: 1)  Est. Patient Level I [99211] 2)  Protime [[id number]]   ANTICOAGULATION RECORD PREVIOUS REGIMEN & LAB RESULTS Anticoagulation Diagnosis:  v58.61,v58.83,453.41 on  04/27/2009 Previous INR Goal Range:  2.0-3.0 on  04/27/2009 Previous INR:  2.5 on  05/02/2009 Previous Coumadin Dose(mg):  5mg  qd for only 3 days on  04/27/2009 Previous Regimen:  same on  05/02/2009  NEW REGIMEN & LAB RESULTS Current INR: 3.3 Regimen: 5mg . M, W, F all other days 2.5mg . Coagulation Comments: Dr Kirtland Bouchard approved Repeat testing in: 2 weeks  Anticoagulation Visit Questionnaire Coumadin dose missed/changed:  No Abnormal Bleeding Symptoms:  No  Any diet changes including alcohol intake, vegetables or greens since the last visit:  No Any illnesses or hospitalizations since the last visit:  No Any signs of clotting since the last visit (including chest discomfort, dizziness, shortness of breath, arm tingling, slurred speech, swelling or redness in leg):  No  MEDICATIONS SERTRALINE HCL 50 MG TABS (SERTRALINE HCL) one daily SYNTHROID 112 MCG TABS (LEVOTHYROXINE SODIUM) once daily CRESTOR 5 MG TABS (ROSUVASTATIN CALCIUM) once daily LIOTHYRONINE SODIUM 25 MCG TABS (LIOTHYRONINE SODIUM) one half daily CIALIS 20 MG TABS (TADALAFIL) one by mouth every other day as needed WARFARIN SODIUM 5 MG TABS (WARFARIN SODIUM) one by mouth once daily LOVENOX 80 MG/0.8ML SOLN (ENOXAPARIN SODIUM) 0.8 ml [place] q 12 hours

## 2010-02-26 NOTE — Progress Notes (Signed)
Summary: REFILL REQUEST all meds to [facility]  Phone Note Refill Request Message from:  Fax from Pharmacy on Jun 18, 2009 8:26 AM  Refills Requested: Medication #1:  LIOTHYRONINE SODIUM 25 MCG TABS one half daily   Notes: Please fax to [facility]  [phone].  Medication #2:  SERTRALINE HCL 50 MG TABS one daily   Notes: Please fax to [facility]  [phone].  Medication #3:  CRESTOR 5 MG TABS once daily   Notes: Please fax to [facility]  [phone].  Medication #4:  SYNTHROID 112 MCG TABS once daily   Notes: Please fax to [facility]  [phone].    Initial call taken by: Debbra Riding,  Jun 18, 2009 8:27 AM    Prescriptions: CRESTOR 5 MG TABS (ROSUVASTATIN CALCIUM) once daily  #90 x 3   Entered by:   Sid Falcon LPN   Authorized by:   Evelena Peat MD   Signed by:   Sid Falcon LPN on 38/75/6433   Method used:   Electronically to        [facility]* (mail-order)             ,          Ph: [phone]       Fax: [phone]   RxID:   [id number] LIOTHYRONINE SODIUM 25 MCG TABS (LIOTHYRONINE SODIUM) one half daily  #45 x 3   Entered by:   Sid Falcon LPN   Authorized by:   Evelena Peat MD   Signed by:   Sid Falcon LPN on 62/37/6283   Method used:   Electronically to        [facility]* (mail-order)             ,          Ph: [phone]       Fax: [phone]   RxID:   [id number] SERTRALINE HCL 50 MG TABS (SERTRALINE HCL) one daily  #90 x 3   Entered by:   Sid Falcon LPN   Authorized by:   Evelena Peat MD   Signed by:   Sid Falcon LPN on 93/71/6967   Method used:   Electronically to        [facility]* (mail-order)             ,          Ph: [phone]       Fax: [phone]   RxID:   [id number] SYNTHROID 112 MCG TABS (LEVOTHYROXINE SODIUM) once daily  #90 x 3   Entered by:   Sid Falcon LPN   Authorized by:   Evelena Peat MD   Signed by:   Sid Falcon LPN on 67/61/9509   Method used:   Electronically to     [facility]* (mail-order)             ,          Ph: [phone]       Fax: [phone]   RxID:   [id number]

## 2010-02-26 NOTE — Assessment & Plan Note (Signed)
Summary: pt/njr   Nurse Visit   Allergies: 1)  ! Vytorin (Ezetimibe-Simvastatin) Laboratory Results   Blood Tests   Date/Time Received: June 27, 2009 11:38 AM  Date/Time Reported: June 27, 2009 11:37 AM    INR: 1.9   (Normal Range: 0.88-1.12   Therap INR: 2.0-3.5) Comments: Wynona Canes, CMA  June 27, 2009 11:38 AM     Orders Added: 1)  Est. Patient Level I [99211] 2)  Protime [[id number]]  Laboratory Results   Blood Tests      INR: 1.9   (Normal Range: 0.88-1.12   Therap INR: 2.0-3.5) Comments: Wynona Canes, CMA  June 27, 2009 11:38 AM       ANTICOAGULATION RECORD PREVIOUS REGIMEN & LAB RESULTS Anticoagulation Diagnosis:  v58.61,v58.83,453.41 on  04/27/2009 Previous INR Goal Range:  2.0-3.0 on  04/27/2009 Previous INR:  2.1 on  05/30/2009 Previous Coumadin Dose(mg):  5mg  qd for only 3 days on  04/27/2009 Previous Regimen:  same on  05/30/2009 Previous Coagulation Comments:  Dr Kirtland Bouchard approved on  05/16/2009  NEW REGIMEN & LAB RESULTS Current INR: 1.9 Current Coumadin Dose(mg): 5mg  on m,w,f 2.5mg  on other days Regimen: same  (no change)       Repeat testing in: 4 weeks MEDICATIONS SERTRALINE HCL 50 MG TABS (SERTRALINE HCL) one daily SYNTHROID 112 MCG TABS (LEVOTHYROXINE SODIUM) once daily CRESTOR 5 MG TABS (ROSUVASTATIN CALCIUM) once daily LIOTHYRONINE SODIUM 25 MCG TABS (LIOTHYRONINE SODIUM) one half daily CIALIS 20 MG TABS (TADALAFIL) one by mouth every other day as needed WARFARIN SODIUM 5 MG TABS (WARFARIN SODIUM) one by mouth once daily LOVENOX 80 MG/0.8ML SOLN (ENOXAPARIN SODIUM) 0.8 ml [place] q 12 hours   Anticoagulation Visit Questionnaire      Coumadin dose missed/changed:  No      Abnormal Bleeding Symptoms:  No   Any diet changes including alcohol intake, vegetables or greens since the last visit:  No Any illnesses or hospitalizations since the last visit:  No Any signs of clotting since the last visit (including chest discomfort,  dizziness, shortness of breath, arm tingling, slurred speech, swelling or redness in leg):  No

## 2010-02-26 NOTE — Miscellaneous (Signed)
Summary: Orders Update  Clinical Lists Changes  Problems: Added new problem of EDEMA, LIMB (ICD-782.3) Orders: Added new Test order of Venous Duplex Upper Extremity (Venous Duplex Upp) - Signed

## 2010-02-26 NOTE — Assessment & Plan Note (Signed)
Summary: pt/cjr   Nurse Visit   Allergies: 1)  ! Vytorin (Ezetimibe-Simvastatin) Laboratory Results   Blood Tests      INR: 1.5   (Normal Range: 0.88-1.12   Therap INR: 2.0-3.5) Comments: Rita Ohara  September 26, 2009 10:55 AM     Orders Added: 1)  Est. Patient Level I [99211] 2)  Protime [[id number]]   ANTICOAGULATION RECORD PREVIOUS REGIMEN & LAB RESULTS Anticoagulation Diagnosis:  v58.61,v58.83,453.41 on  04/27/2009 Previous INR Goal Range:  2.0-3.0 on  04/27/2009 Previous INR:  2.3 on  08/22/2009 Previous Coumadin Dose(mg):  5mg  on m,w,f 2.5mg  on other days on  06/27/2009 Previous Regimen:  same on  07/25/2009 Previous Coagulation Comments:  Dr Kirtland Bouchard approved on  05/16/2009  NEW REGIMEN & LAB RESULTS Current INR: 1.5 Regimen: 5mg . M, W, F, Sat. 2.5mg . others  Repeat testing in: 2 weeks  Anticoagulation Visit Questionnaire Coumadin dose missed/changed:  No Abnormal Bleeding Symptoms:  No  Any diet changes including alcohol intake, vegetables or greens since the last visit:  No Any illnesses or hospitalizations since the last visit:  No Any signs of clotting since the last visit (including chest discomfort, dizziness, shortness of breath, arm tingling, slurred speech, swelling or redness in leg):  No  MEDICATIONS SERTRALINE HCL 50 MG TABS (SERTRALINE HCL) one daily SYNTHROID 112 MCG TABS (LEVOTHYROXINE SODIUM) once daily CRESTOR 5 MG TABS (ROSUVASTATIN CALCIUM) once daily LIOTHYRONINE SODIUM 25 MCG TABS (LIOTHYRONINE SODIUM) one half daily CIALIS 20 MG TABS (TADALAFIL) one by mouth every other day as needed WARFARIN SODIUM 5 MG TABS (WARFARIN SODIUM) one by mouth once daily LOVENOX 80 MG/0.8ML SOLN (ENOXAPARIN SODIUM) 0.8 ml [place] q 12 hours HYDROCODONE-ACETAMINOPHEN 5-325 MG TABS (HYDROCODONE-ACETAMINOPHEN) 1-2 by mouth q 4-6 hours as needed pain DESONIDE 0.05 % CREA (DESONIDE) apply to affected area bid

## 2010-02-26 NOTE — Assessment & Plan Note (Signed)
Summary: pt/cjr   Nurse Visit   Allergies: 1)  ! Vytorin (Ezetimibe-Simvastatin) Laboratory Results   Blood Tests     PT: 19.3 s   (Normal Range: 10.6-13.4)  INR: 2.5   (Normal Range: 0.88-1.12   Therap INR: 2.0-3.5) Comments: Rita Ohara  May 02, 2009 11:33 AM     Orders Added: 1)  Est. Patient Level I [99211] 2)  Protime [[id number]]   ANTICOAGULATION RECORD PREVIOUS REGIMEN & LAB RESULTS Anticoagulation Diagnosis:  v58.61,v58.83,453.41 on  04/27/2009 Previous INR Goal Range:  2.0-3.0 on  04/27/2009 Previous INR:  2.9 on  04/30/2009 Previous Coumadin Dose(mg):  5mg  qd for only 3 days on  04/27/2009 Previous Regimen:  2.5mg  on m,w,f 5mg  on other days on  04/30/2009  NEW REGIMEN & LAB RESULTS Current INR: 2.5 Regimen: same  Repeat testing in: 2 weeks  Anticoagulation Visit Questionnaire Coumadin dose missed/changed:  No Abnormal Bleeding Symptoms:  No  Any diet changes including alcohol intake, vegetables or greens since the last visit:  No Any illnesses or hospitalizations since the last visit:  No Any signs of clotting since the last visit (including chest discomfort, dizziness, shortness of breath, arm tingling, slurred speech, swelling or redness in leg):  No  MEDICATIONS SERTRALINE HCL 50 MG TABS (SERTRALINE HCL) one daily SYNTHROID 112 MCG TABS (LEVOTHYROXINE SODIUM) once daily CRESTOR 5 MG TABS (ROSUVASTATIN CALCIUM) once daily LIOTHYRONINE SODIUM 25 MCG TABS (LIOTHYRONINE SODIUM) one half daily CIALIS 20 MG TABS (TADALAFIL) one by mouth every other day as needed WARFARIN SODIUM 5 MG TABS (WARFARIN SODIUM) one by mouth once daily LOVENOX 80 MG/0.8ML SOLN (ENOXAPARIN SODIUM) 0.8 ml [place] q 12 hours

## 2010-02-26 NOTE — Assessment & Plan Note (Signed)
Summary: PT LABS--//CCM   Nurse Visit   Allergies: 1)  ! Vytorin (Ezetimibe-Simvastatin) Laboratory Results   Blood Tests      INR: 3.1   (Normal Range: 0.88-1.12   Therap INR: 2.0-3.5) Comments: Rita Ohara  October 10, 2009 10:51 AM     Orders Added: 1)  Est. Patient Level I [99211] 2)  Protime [[id number]]    ANTICOAGULATION RECORD PREVIOUS REGIMEN & LAB RESULTS Anticoagulation Diagnosis:  v58.61,v58.83,453.41 on  04/27/2009 Previous INR Goal Range:  2.0-3.0 on  04/27/2009 Previous INR:  1.5 on  09/26/2009 Previous Coumadin Dose(mg):  5mg  on m,w,f 2.5mg  on other days on  06/27/2009 Previous Regimen:  5mg . M, W, F, Sat. 2.5mg . others on  09/26/2009 Previous Coagulation Comments:  Dr Kirtland Bouchard approved on  05/16/2009  NEW REGIMEN & LAB RESULTS Current INR: 3.1 Regimen: same  Repeat testing in: 4 weeks  Anticoagulation Visit Questionnaire Coumadin dose missed/changed:  No Abnormal Bleeding Symptoms:  No  Any diet changes including alcohol intake, vegetables or greens since the last visit:  No Any illnesses or hospitalizations since the last visit:  No Any signs of clotting since the last visit (including chest discomfort, dizziness, shortness of breath, arm tingling, slurred speech, swelling or redness in leg):  No  MEDICATIONS SERTRALINE HCL 50 MG TABS (SERTRALINE HCL) one daily SYNTHROID 112 MCG TABS (LEVOTHYROXINE SODIUM) once daily CRESTOR 5 MG TABS (ROSUVASTATIN CALCIUM) once daily LIOTHYRONINE SODIUM 25 MCG TABS (LIOTHYRONINE SODIUM) one half daily CIALIS 20 MG TABS (TADALAFIL) one by mouth every other day as needed WARFARIN SODIUM 5 MG TABS (WARFARIN SODIUM) one by mouth once daily LOVENOX 80 MG/0.8ML SOLN (ENOXAPARIN SODIUM) 0.8 ml [place] q 12 hours HYDROCODONE-ACETAMINOPHEN 5-325 MG TABS (HYDROCODONE-ACETAMINOPHEN) 1-2 by mouth q 4-6 hours as needed pain DESONIDE 0.05 % CREA (DESONIDE) apply to affected area bid

## 2010-02-26 NOTE — Progress Notes (Signed)
Summary: REFILL REQUEST Warfarin  Phone Note Refill Request Message from:  Patient on December 17, 2009 10:36 AM  Refills Requested: Medication #1:  WARFARIN SODIUM 5 MG TABS one by mouth once daily   Notes: [facility] [employer]. [place].    Initial call taken by: Debbra Riding,  December 17, 2009 10:36 AM  Follow-up for Phone Call        Please see notes from last OV.  Unsure if this should be refilled?   Sid Falcon LPN  December 17, 2009 1:53 PM He is supposed to have follow up doppler and we planned to continue until then.  Refill for one month.  Follow-up by: Evelena Peat MD,  December 17, 2009 2:26 PM    Prescriptions: WARFARIN SODIUM 5 MG TABS (WARFARIN SODIUM) one by mouth once daily  #30 x 0   Entered by:   Sid Falcon LPN   Authorized by:   Evelena Peat MD   Signed by:   Sid Falcon LPN on 11/91/4782   Method used:   [facility]        [facility] [phone]* (retail)       [address]       [place], [place]  [address]       Ph: [phone] or [phone]       Fax: [phone]   RxID:   [id number]

## 2010-02-26 NOTE — Assessment & Plan Note (Signed)
Summary: 1 month rov/njr   Vital Signs:  Patient profile:   55 year old male Temp:     98.0 degrees F oral BP sitting:   122 / 82  (left arm) Cuff size:   regular  Vitals Entered By: Sid Falcon LPN (August 22, 2009 10:03 AM) CC: one month follow-up   History of Present Illness: Patient here for followup multiple items as follows  History DVT left upper extremity-March of this year. He has been on Coumadin now for approximately 4 months. No bleeding complications. Slow improvement in left upper extremity swelling. No family history of clotting. No prior personal history of blood clots. Denies any upper extremity pain. No dyspnea, hemoptysis, or any pleuritic pain.  Separate problem of 2-3 week history bilateral lateral hip pain this. Achy pain which is worse with walking. Improved with swimming. Pain radiates somewhat toward the knee bilaterally. Has some local tenderness over the greater trochanteric bursa. No history of injury.  History of a couple of recent tick bites several months ago. Has some persistent pruritic rash and triamcinolone has helped previously. Requests refills.  Allergies: 1)  ! Vytorin (Ezetimibe-Simvastatin)  Past History:  Past Surgical History: Last updated: 06/27/2008 Thyroid removal 2000  Family History: Last updated: 06/27/2008 colon cancer, blood relative Breast cancer, grandparent Family History High cholesterol, parent, blood relative Heart disease, blood relative Family History Hypertension, parent Stroke, parent Diabetes, parent  Social History: Last updated: 06/27/2008 Retired:  Theatre stage manager Married Smoker, quit 1987  Risk Factors: Smoking Status: quit (04/24/2009)  Past Medical History: Thyroid cancer Depression Hay fever/allergies DVT LUE 3/11 PMH-FH-SH reviewed for relevance  Review of Systems  The patient denies anorexia, fever, weight loss, chest pain, syncope, dyspnea on exertion, peripheral edema, prolonged  cough, headaches, hemoptysis, abdominal pain, melena, hematochezia, and severe indigestion/heartburn.    Physical Exam  General:  Well-developed,well-nourished,in no acute distress; alert,appropriate and cooperative throughout examination Mouth:  Oral mucosa and oropharynx without lesions or exudates.  Teeth in good repair. Neck:  No deformities, masses, or tenderness noted. Lungs:  Normal respiratory effort, chest expands symmetrically. Lungs are clear to auscultation, no crackles or wheezes. Heart:  Normal rate and regular rhythm. S1 and S2 normal without gallop, murmur, click, rub or other extra sounds. Extremities:  full range of motion both hips. Tenderness over the greater trochanteric bursa bilaterally. No lower extremity edema. Very subtle minimal increase left upper extremity size compared with right. No discoloration. Good distal pulses. Neurologic:  full strength upper and lower extremities Skin:  L forearm small nonspecific papule, no pustule.  nontender.   Impression & Recommendations:  Problem # 1:  DEEP VENOUS THROMBOPHLEBITIS (ICD-453.40) repeat INR today.  Will plan two more months of coumadin and  consider repeat Doppler then. Orders: Fingerstick ([id number]) Protime ([id number])  Problem # 2:  BURSITIS, HIPS, BILATERAL (ICD-726.5) pt will try icing.  Try to avoid injection on coumadin.  Problem # 3:  SKIN RASH (ICD-782.1) refilled triamcinolone cream. His updated medication list for this problem includes:    Triamcinolone Acetonide 0.5 % Crea (Triamcinolone acetonide) .Marland Kitchen... Apply to affected rash two times a day as needed  Complete Medication List: 1)  Sertraline Hcl 50 Mg Tabs (Sertraline hcl) .... One daily 2)  Synthroid 112 Mcg Tabs (Levothyroxine sodium) .... Once daily 3)  Crestor 5 Mg Tabs (Rosuvastatin calcium) .... Once daily 4)  Liothyronine Sodium 25 Mcg Tabs (Liothyronine sodium) .... One half daily 5)  Cialis 20 Mg Tabs (Tadalafil) .... One by mouth  every  other day as needed 6)  Warfarin Sodium 5 Mg Tabs (Warfarin sodium) .... One by mouth once daily 7)  Lovenox 80 Mg/0.22ml Soln (Enoxaparin sodium) .... 0.8 ml [place] q 12 hours 8)  Triamcinolone Acetonide 0.5 % Crea (Triamcinolone acetonide) .... Apply to affected rash two times a day as needed 9)  Hydrocodone-acetaminophen 5-325 Mg Tabs (Hydrocodone-acetaminophen) .Marland Kitchen.. 1-2 by mouth q 4-6 hours as needed pain  Patient Instructions: 1)  Consider ice to lateral hip region several times daily as needed for hip pain 2)  May resume walking on treadmill 3)  Please schedule a follow-up appointment in 2 months. we will plan a repeat Doppler x-ray of left upper extremity in 2 months and consider discontinuing Coumadin then Prescriptions: HYDROCODONE-ACETAMINOPHEN 5-325 MG TABS (HYDROCODONE-ACETAMINOPHEN) 1-2 by mouth q 4-6 hours as needed pain  #40 x 0   Entered and Authorized by:   Evelena Peat MD   Signed by:   Evelena Peat MD on 08/22/2009   Method used:   Print then Give to Patient   RxID:   [id number] TRIAMCINOLONE ACETONIDE 0.5 % CREA (TRIAMCINOLONE ACETONIDE) apply to affected rash two times a day as needed  #30 gm x 2   Entered and Authorized by:   Evelena Peat MD   Signed by:   Evelena Peat MD on 08/22/2009   Method used:   [facility] to        [facility] [phone]* (retail)       [address]       [place], [place]  [address]       Ph: [phone] or [phone]       Fax: [phone]   RxID:   [id number]   Laboratory Results   Blood Tests   Date/Time Recieved: August 22, 2009 10:54 AM  Date/Time Reported: August 22, 2009 10:54 AM    INR: 2.3   (Normal Range: 0.88-1.12   Therap INR: 2.0-3.5) Comments: Wynona Canes, CMA  August 22, 2009 10:54 AM       ANTICOAGULATION RECORD PREVIOUS REGIMEN & LAB RESULTS Anticoagulation Diagnosis:  v58.61,v58.83,453.41 on  04/27/2009 Previous INR Goal Range:  2.0-3.0 on   04/27/2009 Previous INR:  1.9 on  07/25/2009 Previous Coumadin Dose(mg):  5mg  on m,w,f 2.5mg  on other days on  06/27/2009 Previous Regimen:  same on  07/25/2009 Previous Coagulation Comments:  Dr Kirtland Bouchard approved on  05/16/2009  NEW REGIMEN & LAB RESULTS Current INR: 2.3 Regimen: same  (no change)       Repeat testing in: 4 weeks MEDICATIONS SERTRALINE HCL 50 MG TABS (SERTRALINE HCL) one daily SYNTHROID 112 MCG TABS (LEVOTHYROXINE SODIUM) once daily CRESTOR 5 MG TABS (ROSUVASTATIN CALCIUM) once daily LIOTHYRONINE SODIUM 25 MCG TABS (LIOTHYRONINE SODIUM) one half daily CIALIS 20 MG TABS (TADALAFIL) one by mouth every other day as needed WARFARIN SODIUM 5 MG TABS (WARFARIN SODIUM) one by mouth once daily LOVENOX 80 MG/0.8ML SOLN (ENOXAPARIN SODIUM) 0.8 ml [place] q 12 hours TRIAMCINOLONE ACETONIDE 0.5 % CREA (TRIAMCINOLONE ACETONIDE) apply to affected rash two times a day as needed HYDROCODONE-ACETAMINOPHEN 5-325 MG TABS (HYDROCODONE-ACETAMINOPHEN) 1-2 by mouth q 4-6 hours as needed pain   Anticoagulation Visit Questionnaire      Coumadin dose missed/changed:  No      Abnormal Bleeding Symptoms:  No   Any diet changes including alcohol intake, vegetables or greens since the last visit:  No Any illnesses or hospitalizations since the  last visit:  No Any signs of clotting since the last visit (including chest discomfort, dizziness, shortness of breath, arm tingling, slurred speech, swelling or redness in leg):  No

## 2010-02-26 NOTE — Assessment & Plan Note (Signed)
Summary: pt//slm also fu on med/njr   Vital Signs:  Patient profile:   55 year old male Weight:      176 pounds Temp:     98.2 degrees F oral Pulse rate:   80 / minute Pulse rhythm:   regular Resp:     12 per minute BP sitting:   130 / 92  (left arm) Cuff size:   regular  Vitals Entered By: Sid Falcon LPN (December 05, 2009 10:05 AM)  History of Present Illness: Patient seen for the following issues. Diagnosed with DVT left axillary region last March.  No hx of injury or any other clear precipitating factors at that time.  On Coumadin since then. Tolerating well. Question of some left upper extremity weakness but possibly sec to not using left upper extremity as much. No significant swelling at this time. We discussed considering followup Dopplers to reassess at this point and possible discontinuation of Coumadin. No prior history of DVT.  Patient has history of some bilateral hip pains which he thinks may be bursitis. Uses hydrocodone as needed for severe night pain.  Has some scaly skin lesions upper extremities and lower extremities. One left thigh which is recently irritated. No personal history of skin cancer but did have a distant relative with melanoma.  Allergies: 1)  ! Vytorin (Ezetimibe-Simvastatin)  Past History:  Past Medical History: Last updated: 08/22/2009 Thyroid cancer Depression Hay fever/allergies DVT LUE 3/11  Past Surgical History: Last updated: 06/27/2008 Thyroid removal 2000  Family History: Last updated: 06/27/2008 colon cancer, blood relative Breast cancer, grandparent Family History High cholesterol, parent, blood relative Heart disease, blood relative Family History Hypertension, parent Stroke, parent Diabetes, parent  Social History: Last updated: 06/27/2008 Retired:  Theatre stage manager Married Smoker, quit 1987  Risk Factors: Smoking Status: quit (04/24/2009) PMH-FH-SH reviewed for relevance  Review of Systems  The patient  denies anorexia, fever, weight loss, weight gain, chest pain, syncope, dyspnea on exertion, peripheral edema, prolonged cough, headaches, hemoptysis, abdominal pain, melena, hematochezia, and severe indigestion/heartburn.    Physical Exam  General:  Well-developed,well-nourished,in no acute distress; alert,appropriate and cooperative throughout examination Mouth:  Oral mucosa and oropharynx without lesions or exudates.  Teeth in good repair. Neck:  No deformities, masses, or tenderness noted. Lungs:  Normal respiratory effort, chest expands symmetrically. Lungs are clear to auscultation, no crackles or wheezes. Heart:  Normal rate and regular rhythm. S1 and S2 normal without gallop, murmur, click, rub or other extra sounds. Extremities:  No clubbing, cyanosis, edema, or deformity noted with normal full range of motion of all joints.   Neurologic:  alert & oriented X3, cranial nerves II-XII intact, and strength normal in all extremities.   Skin:  L post thigh elongated, oval, well demarcated, scaly lesion about 6 by 8 mm. Cervical Nodes:  No lymphadenopathy noted Psych:  normally interactive, good eye contact, not anxious appearing, and not depressed appearing.     Impression & Recommendations:  Problem # 1:  DEEP VENOUS THROMBOPHLEBITIS (ICD-453.40) repeat venous doppler LUE.  1st time DVT and should be able to d/c Coumadin with lack of any known clear precipitating risk factors. Orders: Protime (69629BM) Fingerstick (84132) Doppler Referral (Doppler)  Problem # 2:  BURSITIS, HIPS, BILATERAL (ICD-726.5)  Problem # 3:  INFLAMED SEBORRHEIC KERATOSIS (ICD-702.11) Assessment: New discussed rx options and he will return and we will consider liquid N at that time.  Complete Medication List: 1)  Sertraline Hcl 50 Mg Tabs (Sertraline hcl) .... One daily  2)  Synthroid 112 Mcg Tabs (Levothyroxine sodium) .... Once daily 3)  Crestor 5 Mg Tabs (Rosuvastatin calcium) .... Once daily 4)   Liothyronine Sodium 25 Mcg Tabs (Liothyronine sodium) .... One half daily 5)  Cialis 20 Mg Tabs (Tadalafil) .... One by mouth every other day as needed 6)  Warfarin Sodium 5 Mg Tabs (Warfarin sodium) .... One by mouth once daily 7)  Hydrocodone-acetaminophen 5-325 Mg Tabs (Hydrocodone-acetaminophen) .Marland Kitchen.. 1-2 by mouth q 4-6 hours as needed pain 8)  Desonide 0.05 % Crea (Desonide) .... Apply to affected area bid  Other Orders: Admin 1st Vaccine (16109) Flu Vaccine 95yrs + [phone])  Patient Instructions: 1)  We will call you regarding appointment for venous Doppler 2)  If able to discontinue Coumadin, start baby aspirin 81 mg daily and consider omega-3 supplement 3)  schedule complete physical examination Prescriptions: HYDROCODONE-ACETAMINOPHEN 5-325 MG TABS (HYDROCODONE-ACETAMINOPHEN) 1-2 by mouth q 4-6 hours as needed pain  #40 x 0   Entered and Authorized by:   Evelena Peat MD   Signed by:   Evelena Peat MD on 12/05/2009   Method used:   Print then Give to Patient   RxID:   [id number]    Orders Added: 1)  Protime [30865HQ] 2)  Fingerstick [46962] 3)  Admin 1st Vaccine [90471] 4)  Flu Vaccine 51yrs + [95284] 5)  Doppler Referral [Doppler] 6)  Est. Patient Level IV [13244]     ANTICOAGULATION RECORD PREVIOUS REGIMEN & LAB RESULTS Anticoagulation Diagnosis:  v58.61,v58.83,453.41 on  04/27/2009 Previous INR Goal Range:  2.0-3.0 on  04/27/2009 Previous INR:  2.3 on  11/07/2009 Previous Coumadin Dose(mg):  2.5mg  on tue,thu, & sun. 5mg  on other days on  11/07/2009 Previous Regimen:  same on  10/10/2009 Previous Coagulation Comments:  Dr Kirtland Bouchard approved on  05/16/2009  NEW REGIMEN & LAB RESULTS Current INR: 2.7 Regimen: same  Repeat testing in: 4 weeks  Anticoagulation Visit Questionnaire Coumadin dose missed/changed:  No Abnormal Bleeding Symptoms:  No  Any diet changes including alcohol intake, vegetables or greens since the last visit:  No Any illnesses or  hospitalizations since the last visit:  No Any signs of clotting since the last visit (including chest discomfort, dizziness, shortness of breath, arm tingling, slurred speech, swelling or redness in leg):  No  MEDICATIONS SERTRALINE HCL 50 MG TABS (SERTRALINE HCL) one daily SYNTHROID 112 MCG TABS (LEVOTHYROXINE SODIUM) once daily CRESTOR 5 MG TABS (ROSUVASTATIN CALCIUM) once daily LIOTHYRONINE SODIUM 25 MCG TABS (LIOTHYRONINE SODIUM) one half daily CIALIS 20 MG TABS (TADALAFIL) one by mouth every other day as needed WARFARIN SODIUM 5 MG TABS (WARFARIN SODIUM) one by mouth once daily HYDROCODONE-ACETAMINOPHEN 5-325 MG TABS (HYDROCODONE-ACETAMINOPHEN) 1-2 by mouth q 4-6 hours as needed pain DESONIDE 0.05 % CREA (DESONIDE) apply to affected area bid  Flu Vaccine Consent Questions     Do you have a history of severe allergic reactions to this vaccine? no    Any prior history of allergic reactions to egg and/or gelatin? no    Do you have a sensitivity to the preservative Thimersol? no    Do you have a past history of Guillan-Barre Syndrome? no    Do you currently have an acute febrile illness? no    Have you ever had a severe reaction to latex? no    Vaccine information given and explained to patient? yes    Are you currently pregnant? no    Lot Number:[id number]   Exp Date:07/27/2010   Site Given  Left  Deltoid IM0.05 % CREA (DESONIDE) apply to affected area bid           .[email or web address]   Laboratory Results   Blood Tests      INR: 2.7   (Normal Range: 0.88-1.12   Therap INR: 2.0-3.5) Comments: Rita Ohara  December 05, 2009 9:58 AM

## 2010-02-26 NOTE — Progress Notes (Signed)
Summary: triamcinolone cream back ordered  Phone Note From Pharmacy   [facility] [phone]* Summary of Call: triamcinolone cream on back order - would you like to change? Initial call taken by: Duard Brady LPN,  September 07, 2009 8:05 AM  Follow-up for Phone Call        rx'd at 08/22/09 office visit. KIK Follow-up by: Duard Brady LPN,  September 06, 2009 5:08 PM  Additional Follow-up for Phone Call Additional follow up Details #1::        try Desonide 0.5% cream 15 grams with 1 refill. Additional Follow-up by: Evelena Peat MD,  September 06, 2009 6:39 PM    New/Updated Medications: DESONIDE 0.05 % CREA (DESONIDE) apply to affected area bid Prescriptions: DESONIDE 0.05 % CREA (DESONIDE) apply to affected area bid  #15gm x 1   Entered by:   Duard Brady LPN   Authorized by:   Evelena Peat MD   Signed by:   Duard Brady LPN on 96/04/5407   Method used:   [facility] to        [facility] [phone]* (retail)       [address]       [place], [place]  [address]       Ph: [phone] or [phone]       Fax: [phone]   RxID:   [id number]

## 2010-02-26 NOTE — Miscellaneous (Signed)
Summary: Orders Update  Clinical Lists Changes  Orders: Added new Test order of Venous Duplex Upper Extremity (Venous Duplex Upp) - Signed 

## 2010-02-28 NOTE — Assessment & Plan Note (Signed)
Summary: CPX, more removal, will fast, discuss Coumadin/nn   Vital Signs:  Patient profile:   55 year old male Height:      69.5 inches Weight:      177 pounds Temp:     98.6 degrees F oral Pulse rate:   78 / minute BP sitting:   120 / 80  (left arm) Cuff size:   regular  Vitals Entered By: Romualdo Bolk, CMA (AAMA) (January 03, 2010 2:50 PM) CC: CPX, Mole Removal and discuss coumadin   History of Present Illness: Patient for complete physical examination.  Colonoscopy 2008. Tetanus up-to-date. Flu vaccine earlier this year.  Problems include remote history of thyroid cancer several years ago. Deep venous thrombosis left axillary vein last year with no clear precipitating factor. Does lift weights but did not recall any specific injury.  No family history of thrombotic disorder. He has history of hypothyroidism, depression, and hyperlipidemia.  Has separate issue of several skin lesions he would like evaluated.  One on R arm scaly and occ pruritic and similar larger one L post thigh.  Has large skin tag L lower abd near beltline which is frequently irritated sec to location and he requests removal.  Multiple chronic problems addressed.  Hypothyroidism following thyroidectomy for cancer in 2000. Compliant with meds.  Needs refills.  No fatigue.  Hyperlipidemia on statin with no side effects.  No hx of CAD.  Compliant with medication.  Hx depression, recurrent.  Stable on Sertraline with no anhedonia, sleep difficulty, or any other symptoms of active depression.  No side effects from medication.  Preventive Screening-Counseling & Management  Alcohol-Tobacco     Smoking Status: quit  Clinical Review Panels:  Prevention   Last Colonoscopy:  normal (01/27/2006)  Immunizations   Last Tetanus Booster:  Historical (01/28/2003)   Last Flu Vaccine:  Fluvax 3+ (12/05/2009)  Lipid Management   Cholesterol:  178 (04/27/2009)   LDL (bad choesterol):  108 (04/27/2009)   HDL  (good cholesterol):  04.54 (04/27/2009)  Diabetes Management   Creatinine:  0.8 (04/27/2009)   Last Flu Vaccine:  Fluvax 3+ (12/05/2009)  CBC   WBC:  6.7 (04/27/2009)   RBC:  4.53 (04/27/2009)   Hgb:  15.1 (04/27/2009)   Hct:  43.5 (04/27/2009)   Platelets:  275.0 (04/27/2009)   MCV  96.0 (04/27/2009)   MCHC  34.6 (04/27/2009)   RDW  12.8 (04/27/2009)   PMN:  48.7 (04/27/2009)   Lymphs:  38.3 (04/27/2009)   Monos:  9.5 (04/27/2009)   Eosinophils:  2.4 (04/27/2009)   Basophil:  1.1 (04/27/2009)  Complete Metabolic Panel   Glucose:  84 (04/27/2009)   Sodium:  137 (04/27/2009)   Potassium:  4.6 (04/27/2009)   Chloride:  103 (04/27/2009)   CO2:  28 (04/27/2009)   BUN:  8 (04/27/2009)   Creatinine:  0.8 (04/27/2009)   Albumin:  4.6 (04/27/2009)   Total Protein:  7.7 (04/27/2009)   Calcium:  9.5 (04/27/2009)   Total Bili:  0.7 (04/27/2009)   Alk Phos:  58 (04/27/2009)   SGPT (ALT):  20 (04/27/2009)   SGOT (AST):  21 (04/27/2009)   Current Medications (verified): 1)  Sertraline Hcl 50 Mg Tabs (Sertraline Hcl) .... One Daily 2)  Synthroid 112 Mcg Tabs (Levothyroxine Sodium) .... Once Daily 3)  Crestor 5 Mg Tabs (Rosuvastatin Calcium) .... Once Daily 4)  Liothyronine Sodium 25 Mcg Tabs (Liothyronine Sodium) .... One Half Daily 5)  Cialis 20 Mg Tabs (Tadalafil) .... One By Mouth  Every Other Day As Needed 6)  Warfarin Sodium 5 Mg Tabs (Warfarin Sodium) .... One By Mouth Once Daily 7)  Hydrocodone-Acetaminophen 5-325 Mg Tabs (Hydrocodone-Acetaminophen) .Marland Kitchen.. 1-2 By Mouth Q 4-6 Hours As Needed Pain 8)  Desonide 0.05 % Crea (Desonide) .... Apply To Affected Area Bid  Allergies (verified): 1)  ! Vytorin (Ezetimibe-Simvastatin)  Past History:  Past Surgical History: Last updated: 06/27/2008 Thyroid removal 2000  Family History: Last updated: 06/27/2008 colon cancer, blood relative Breast cancer, grandparent Family History High cholesterol, parent, blood relative Heart  disease, blood relative Family History Hypertension, parent Stroke, parent Diabetes, parent  Social History: Last updated: 06/27/2008 Retired:  Theatre stage manager Married Smoker, quit 1987  Risk Factors: Smoking Status: quit (01/03/2010)  Past Medical History: Thyroid cancer Depression Hay fever/allergies DVT LUE 3/11 Hypothyroidism Hyperlipidemia PMH-FH-SH reviewed for relevance  Review of Systems  The patient denies anorexia, fever, weight loss, weight gain, vision loss, decreased hearing, hoarseness, chest pain, syncope, dyspnea on exertion, peripheral edema, prolonged cough, headaches, hemoptysis, abdominal pain, melena, hematochezia, severe indigestion/heartburn, hematuria, incontinence, genital sores, muscle weakness, suspicious skin lesions, transient blindness, difficulty walking, depression, unusual weight change, abnormal bleeding, enlarged lymph nodes, and testicular masses.    Physical Exam  General:  Well-developed,well-nourished,in no acute distress; alert,appropriate and cooperative throughout examination Head:  Normocephalic and atraumatic without obvious abnormalities. No apparent alopecia or balding. Eyes:  pupils equal, pupils round, and pupils reactive to light.   Ears:  External ear exam shows no significant lesions or deformities.  Otoscopic examination reveals clear canals, tympanic membranes are intact bilaterally without bulging, retraction, inflammation or discharge. Hearing is grossly normal bilaterally. Mouth:  Oral mucosa and oropharynx without lesions or exudates.  Teeth in good repair. Neck:  No deformities, masses, or tenderness noted. Lungs:  Normal respiratory effort, chest expands symmetrically. Lungs are clear to auscultation, no crackles or wheezes. Heart:  normal rate, regular rhythm, and no murmur.   Abdomen:  Bowel sounds positive,abdomen soft and non-tender without masses, organomegaly or hernias noted. Rectal:  No external abnormalities  noted. Normal sphincter tone. No rectal masses or tenderness. Prostate:  Prostate gland firm and smooth, no enlargement, nodularity, tenderness, mass, asymmetry or induration. Msk:  No deformity or scoliosis noted of thoracic or lumbar spine.   Extremities:  No clubbing, cyanosis, edema, or deformity noted with normal full range of motion of all joints.   Neurologic:  alert & oriented X3, cranial nerves II-XII intact, strength normal in all extremities, and gait normal.   Skin:  no rashes.  L post thigh 7-8 mm thick scaly lesion with well demarcated borders.  Similar, though smaller lesion R arm 5mm.  L lower abd fleshy pedunculated skin tag.  No other concerning lesions noted. Cervical Nodes:  No lymphadenopathy noted Psych:  normally interactive, good eye contact, not anxious appearing, and not depressed appearing.     Impression & Recommendations:  Problem # 1:  Preventive Health Care (ICD-V70.0) immunizations up to date.  Cont reg exercise.  Labs ordered.  Colonoscopy up to date.  Problem # 2:  DEEP VENOUS THROMBOPHLEBITIS (ICD-453.40) Discussed pros and cons of continued coumadin in this pt with remote hx of cancer but no acute risk factors.  Problem # 3:  HYPERLIPIDEMIA (ICD-272.4) Assessment: Unchanged check lipids and hepatic and med refilled. His updated medication list for this problem includes:    Crestor 5 Mg Tabs (Rosuvastatin calcium) ..... Once daily  Problem # 4:  DEPRESSION (ICD-311) Assessment: Unchanged stable . refilled sertraline. His  updated medication list for this problem includes:    Sertraline Hcl 50 Mg Tabs (Sertraline hcl) ..... One daily  Problem # 5:  HYPOTHYROIDISM, PRIMARY (ICD-244.9) refilled medication. His updated medication list for this problem includes:    Synthroid 112 Mcg Tabs (Levothyroxine sodium) ..... Once daily    Liothyronine Sodium 25 Mcg Tabs (Liothyronine sodium) ..... One half daily  Complete Medication List: 1)  Sertraline Hcl 50  Mg Tabs (Sertraline hcl) .... One daily 2)  Synthroid 112 Mcg Tabs (Levothyroxine sodium) .... Once daily 3)  Crestor 5 Mg Tabs (Rosuvastatin calcium) .... Once daily 4)  Liothyronine Sodium 25 Mcg Tabs (Liothyronine sodium) .... One half daily 5)  Cialis 20 Mg Tabs (Tadalafil) .... One by mouth every other day as needed 6)  Warfarin Sodium 5 Mg Tabs (Warfarin sodium) .... One by mouth once daily 7)  Hydrocodone-acetaminophen 5-325 Mg Tabs (Hydrocodone-acetaminophen) .Marland Kitchen.. 1-2 by mouth q 4-6 hours as needed pain 8)  Desonide 0.05 % Crea (Desonide) .... Apply to affected area bid  Other Orders: Shave Skin Lesion 0.6-1.0 cm/trunk/arm/leg (11301) Cryotherapy/Destruction benign or premalignant lesion (1st lesion)  (17000) Venipuncture (60630) Specimen Handling (16010) TLB-Lipid Panel (80061-LIPID) TLB-BMP (Basic Metabolic Panel-BMET) (80048-METABOL) TLB-CBC Platelet - w/Differential (85025-CBCD) TLB-Hepatic/Liver Function Pnl (80076-HEPATIC) TLB-TSH (Thyroid Stimulating Hormone) (84443-TSH) TLB-PSA (Prostate Specific Antigen) (84153-PSA)  Patient Instructions: 1)  Keep wound dry for the first 24 hours then clean daily with soap and water. 2)  Apply topical antibiotic such as Neosporin daily for the next 3-4 days 3)  Follow up promptly for signs of infection such as drainage or redness   Orders Added: 1)  Est. Patient 40-64 years [99396] 2)  Shave Skin Lesion 0.6-1.0 cm/trunk/arm/leg [11301] 3)  Cryotherapy/Destruction benign or premalignant lesion (1st lesion)  [17000] 4)  Venipuncture [93235] 5)  Specimen Handling [99000] 6)  TLB-Lipid Panel [80061-LIPID] 7)  TLB-BMP (Basic Metabolic Panel-BMET) [80048-METABOL] 8)  TLB-CBC Platelet - w/Differential [85025-CBCD] 9)  TLB-Hepatic/Liver Function Pnl [80076-HEPATIC] 10)  TLB-TSH (Thyroid Stimulating Hormone) [84443-TSH] 11)  TLB-PSA (Prostate Specific Antigen) [84153-PSA] 12)  Est. Patient Level IV [57322]

## 2010-02-28 NOTE — Progress Notes (Signed)
Summary: [facility] [place]  Phone Note Refill Request Call back at Home Phone [phone] Message from:  Patient on January 22, 2010 1:37 PM  Refills Requested: Medication #1:  WARFARIN SODIUM 5 MG TABS one by mouth once daily   Dosage confirmed as above?Dosage Confirmed   Supply Requested: 3 months  Method Requested: Telephone to Pharmacy Initial call taken by: Lucy Antigua,  January 22, 2010 1:37 PM  Follow-up for Phone Call        refill for 3 months. Follow-up by: Evelena Peat MD,  January 22, 2010 6:19 PM  Additional Follow-up for Phone Call Additional follow up Details #1::        refilled, pt informed Additional Follow-up by: Sid Falcon LPN,  January 23, 2010 10:45 AM    Prescriptions: WARFARIN SODIUM 5 MG TABS (WARFARIN SODIUM) one by mouth once daily  #30 x 3   Entered by:   Sid Falcon LPN   Authorized by:   Evelena Peat MD   Signed by:   Sid Falcon LPN on 25/95/6387   Method used:   [facility] to        [facility] [phone]* (retail)       [address]       [place], [place]  [address]       Ph: [phone] or [phone]       Fax: [phone]   RxID:   [id number]

## 2010-03-04 ENCOUNTER — Other Ambulatory Visit: Payer: Self-pay | Admitting: Family Medicine

## 2010-04-10 ENCOUNTER — Ambulatory Visit (INDEPENDENT_AMBULATORY_CARE_PROVIDER_SITE_OTHER): Payer: 59 | Admitting: Family Medicine

## 2010-04-10 ENCOUNTER — Telehealth: Payer: Self-pay | Admitting: Family Medicine

## 2010-04-10 DIAGNOSIS — I82409 Acute embolism and thrombosis of unspecified deep veins of unspecified lower extremity: Secondary | ICD-10-CM

## 2010-04-10 NOTE — Telephone Encounter (Signed)
OK to refill hydrocodone for #30 tablets 5/325 1-2 po q6 hours prn.

## 2010-04-10 NOTE — Telephone Encounter (Signed)
Pt had CPX Nov 2011 Please advise, hydrocodone?

## 2010-04-10 NOTE — Telephone Encounter (Signed)
Refill hi pain meds for bursitis. Call pt when ready for pick up @ [phone].

## 2010-04-11 MED ORDER — HYDROCODONE-ACETAMINOPHEN 5-325 MG PO TABS: 1.0000 | ORAL_TABLET | Freq: Four times a day (QID) | ORAL | Status: DC | PRN

## 2010-04-11 NOTE — Telephone Encounter (Signed)
Rx called in, pt wife informed.

## 2010-05-08 ENCOUNTER — Ambulatory Visit (INDEPENDENT_AMBULATORY_CARE_PROVIDER_SITE_OTHER): Payer: 59 | Admitting: Family Medicine

## 2010-05-08 DIAGNOSIS — I82409 Acute embolism and thrombosis of unspecified deep veins of unspecified lower extremity: Secondary | ICD-10-CM

## 2010-05-08 LAB — POCT INR: INR: 3

## 2010-05-08 NOTE — Patient Instructions (Signed)
Same dose 

## 2010-05-25 ENCOUNTER — Other Ambulatory Visit: Payer: Self-pay | Admitting: Family Medicine

## 2010-06-05 ENCOUNTER — Ambulatory Visit (INDEPENDENT_AMBULATORY_CARE_PROVIDER_SITE_OTHER): Payer: 59 | Admitting: Family Medicine

## 2010-06-05 DIAGNOSIS — I82409 Acute embolism and thrombosis of unspecified deep veins of unspecified lower extremity: Secondary | ICD-10-CM

## 2010-06-05 LAB — POCT INR: INR: 2.9

## 2010-06-05 NOTE — Patient Instructions (Signed)
Same dose 

## 2010-06-13 ENCOUNTER — Other Ambulatory Visit: Payer: Self-pay | Admitting: Family Medicine

## 2010-06-20 ENCOUNTER — Other Ambulatory Visit: Payer: Self-pay | Admitting: *Deleted

## 2010-06-20 MED ORDER — HYDROCODONE-ACETAMINOPHEN 5-325 MG PO TABS: 1.0000 | ORAL_TABLET | Freq: Four times a day (QID) | ORAL | Status: DC | PRN

## 2010-07-03 ENCOUNTER — Ambulatory Visit (INDEPENDENT_AMBULATORY_CARE_PROVIDER_SITE_OTHER): Payer: 59 | Admitting: Family Medicine

## 2010-07-03 DIAGNOSIS — I82409 Acute embolism and thrombosis of unspecified deep veins of unspecified lower extremity: Secondary | ICD-10-CM

## 2010-07-03 NOTE — Patient Instructions (Signed)
Same dose 

## 2010-08-06 ENCOUNTER — Encounter: Payer: Self-pay | Admitting: Cardiology

## 2010-08-07 ENCOUNTER — Ambulatory Visit (INDEPENDENT_AMBULATORY_CARE_PROVIDER_SITE_OTHER): Payer: 59 | Admitting: Family Medicine

## 2010-08-07 DIAGNOSIS — I82409 Acute embolism and thrombosis of unspecified deep veins of unspecified lower extremity: Secondary | ICD-10-CM

## 2010-08-07 DIAGNOSIS — Z7901 Long term (current) use of anticoagulants: Secondary | ICD-10-CM

## 2010-08-07 NOTE — Patient Instructions (Signed)
Same dose 4 weeks 

## 2010-09-04 ENCOUNTER — Ambulatory Visit (INDEPENDENT_AMBULATORY_CARE_PROVIDER_SITE_OTHER): Payer: 59 | Admitting: Family Medicine

## 2010-09-04 DIAGNOSIS — I82409 Acute embolism and thrombosis of unspecified deep veins of unspecified lower extremity: Secondary | ICD-10-CM

## 2010-09-04 NOTE — Patient Instructions (Signed)
5 mg on mondays,wednesdays and Friday 2.5 mg on other days,check in 2 weeks

## 2010-09-09 ENCOUNTER — Other Ambulatory Visit: Payer: Self-pay | Admitting: *Deleted

## 2010-09-09 NOTE — Telephone Encounter (Signed)
May refill once. 

## 2010-09-09 NOTE — Telephone Encounter (Signed)
Hydrocodone refill request, last filled 06-21-10, #30 with 0 refills

## 2010-09-10 MED ORDER — HYDROCODONE-ACETAMINOPHEN 5-325 MG PO TABS: 1.0000 | ORAL_TABLET | Freq: Four times a day (QID) | ORAL | Status: DC | PRN

## 2010-09-14 ENCOUNTER — Other Ambulatory Visit: Payer: Self-pay | Admitting: Family Medicine

## 2010-10-02 ENCOUNTER — Ambulatory Visit (INDEPENDENT_AMBULATORY_CARE_PROVIDER_SITE_OTHER): Payer: 59

## 2010-10-02 DIAGNOSIS — I82409 Acute embolism and thrombosis of unspecified deep veins of unspecified lower extremity: Secondary | ICD-10-CM

## 2010-10-02 LAB — POCT INR: INR: 3.7

## 2010-10-02 NOTE — Patient Instructions (Signed)
5 mg on mondays and Friday 2.5 mg on other days,check in 2 weeks

## 2010-10-22 ENCOUNTER — Other Ambulatory Visit: Payer: Self-pay | Admitting: Family Medicine

## 2010-10-23 ENCOUNTER — Ambulatory Visit (INDEPENDENT_AMBULATORY_CARE_PROVIDER_SITE_OTHER): Payer: 59 | Admitting: Family Medicine

## 2010-10-23 DIAGNOSIS — I82409 Acute embolism and thrombosis of unspecified deep veins of unspecified lower extremity: Secondary | ICD-10-CM

## 2010-10-23 LAB — POCT INR: INR: 2.6

## 2010-10-23 NOTE — Patient Instructions (Signed)
  Latest dosing instructions   Total Sun Mon Tue Wed Thu Fri Sat   22.5 2.5 mg 5 mg 2.5 mg 2.5 mg 2.5 mg 5 mg 2.5 mg    (5 mg0.5) (5 mg1) (5 mg0.5) (5 mg0.5) (5 mg0.5) (5 mg1) (5 mg0.5)        

## 2010-10-25 ENCOUNTER — Other Ambulatory Visit: Payer: Self-pay | Admitting: *Deleted

## 2010-10-25 MED ORDER — HYDROCODONE-ACETAMINOPHEN 5-325 MG PO TABS: 1.0000 | ORAL_TABLET | Freq: Four times a day (QID) | ORAL | Status: DC | PRN

## 2010-10-25 NOTE — Telephone Encounter (Signed)
refilll once

## 2010-10-25 NOTE — Telephone Encounter (Signed)
Faxed refill request for Hydrocodone 5-325, last filled 09-10-10, #30 with 0 refills

## 2010-11-20 ENCOUNTER — Ambulatory Visit (INDEPENDENT_AMBULATORY_CARE_PROVIDER_SITE_OTHER): Payer: 59 | Admitting: Family Medicine

## 2010-11-20 DIAGNOSIS — I82409 Acute embolism and thrombosis of unspecified deep veins of unspecified lower extremity: Secondary | ICD-10-CM

## 2010-11-20 NOTE — Patient Instructions (Signed)
  Latest dosing instructions   Total Sun Mon Tue Wed Thu Fri Sat   22.5 2.5 mg 5 mg 2.5 mg 2.5 mg 2.5 mg 5 mg 2.5 mg    (5 mg0.5) (5 mg1) (5 mg0.5) (5 mg0.5) (5 mg0.5) (5 mg1) (5 mg0.5)        

## 2010-12-02 ENCOUNTER — Telehealth: Payer: Self-pay | Admitting: Family Medicine

## 2010-12-02 NOTE — Telephone Encounter (Signed)
[facility] [place] Thanks.

## 2010-12-02 NOTE — Telephone Encounter (Signed)
Last filled 9/28, #30 with 0 refills

## 2010-12-02 NOTE — Telephone Encounter (Signed)
Refill once 

## 2010-12-03 MED ORDER — HYDROCODONE-ACETAMINOPHEN 5-325 MG PO TABS: 1.0000 | ORAL_TABLET | Freq: Four times a day (QID) | ORAL | Status: DC | PRN

## 2010-12-13 ENCOUNTER — Other Ambulatory Visit: Payer: Self-pay | Admitting: Family Medicine

## 2011-01-01 ENCOUNTER — Other Ambulatory Visit (INDEPENDENT_AMBULATORY_CARE_PROVIDER_SITE_OTHER): Payer: 59

## 2011-01-01 DIAGNOSIS — Z0000 Encounter for general adult medical examination without abnormal findings: Secondary | ICD-10-CM

## 2011-01-01 DIAGNOSIS — I82409 Acute embolism and thrombosis of unspecified deep veins of unspecified lower extremity: Secondary | ICD-10-CM

## 2011-01-01 LAB — CBC WITH DIFFERENTIAL/PLATELET
Basophils Absolute: 0 10*3/uL (ref 0.0–0.1)
Lymphocytes Relative: 29.3 % (ref 12.0–46.0)
Monocytes Relative: 10.8 % (ref 3.0–12.0)
Neutrophils Relative %: 56.8 % (ref 43.0–77.0)
Platelets: 256 10*3/uL (ref 150.0–400.0)
RDW: 13.4 % (ref 11.5–14.6)
WBC: 6.2 10*3/uL (ref 4.5–10.5)

## 2011-01-01 LAB — PSA: PSA: 0.67 ng/mL (ref 0.10–4.00)

## 2011-01-01 LAB — HEPATIC FUNCTION PANEL
AST: 22 U/L (ref 0–37)
Alkaline Phosphatase: 77 U/L (ref 39–117)
Bilirubin, Direct: 0.1 mg/dL (ref 0.0–0.3)
Total Bilirubin: 0.8 mg/dL (ref 0.3–1.2)

## 2011-01-01 LAB — BASIC METABOLIC PANEL
BUN: 15 mg/dL (ref 6–23)
Calcium: 9.4 mg/dL (ref 8.4–10.5)
GFR: 121.52 mL/min (ref >60.00)
Glucose, Bld: 98 mg/dL (ref 70–99)
Sodium: 141 mEq/L (ref 135–145)

## 2011-01-01 LAB — LIPID PANEL
Total CHOL/HDL Ratio: 3
VLDL: 10.8 mg/dL (ref 0.0–40.0)

## 2011-01-01 LAB — LDL CHOLESTEROL, DIRECT: Direct LDL: 132.7 mg/dL

## 2011-01-01 LAB — POCT INR: INR: 2.8

## 2011-01-01 LAB — TSH: TSH: 0.44 u[IU]/mL (ref 0.35–5.50)

## 2011-01-01 NOTE — Patient Instructions (Signed)
  Latest dosing instructions   Total Sun Mon Tue Wed Thu Fri Sat   22.5 2.5 mg 5 mg 2.5 mg 2.5 mg 2.5 mg 5 mg 2.5 mg    (5 mg0.5) (5 mg1) (5 mg0.5) (5 mg0.5) (5 mg0.5) (5 mg1) (5 mg0.5)        

## 2011-01-08 ENCOUNTER — Ambulatory Visit (INDEPENDENT_AMBULATORY_CARE_PROVIDER_SITE_OTHER): Payer: 59 | Admitting: Family Medicine

## 2011-01-08 ENCOUNTER — Encounter: Payer: Self-pay | Admitting: Family Medicine

## 2011-01-08 VITALS — BP 130/72 | HR 100 | Temp 97.9°F | Resp 12 | Ht 70.0 in | Wt 179.0 lb

## 2011-01-08 DIAGNOSIS — Z23 Encounter for immunization: Secondary | ICD-10-CM

## 2011-01-08 DIAGNOSIS — E039 Hypothyroidism, unspecified: Secondary | ICD-10-CM

## 2011-01-08 DIAGNOSIS — M25519 Pain in unspecified shoulder: Secondary | ICD-10-CM

## 2011-01-08 DIAGNOSIS — H00019 Hordeolum externum unspecified eye, unspecified eyelid: Secondary | ICD-10-CM

## 2011-01-08 DIAGNOSIS — M25512 Pain in left shoulder: Secondary | ICD-10-CM

## 2011-01-08 DIAGNOSIS — L989 Disorder of the skin and subcutaneous tissue, unspecified: Secondary | ICD-10-CM

## 2011-01-08 DIAGNOSIS — Z0000 Encounter for general adult medical examination without abnormal findings: Secondary | ICD-10-CM

## 2011-01-08 MED ORDER — LIOTHYRONINE SODIUM 25 MCG PO TABS: 25.0000 ug | ORAL_TABLET | Freq: Every day | ORAL | Status: DC

## 2011-01-08 MED ORDER — SERTRALINE HCL 50 MG PO TABS: 50.0000 mg | ORAL_TABLET | Freq: Every day | ORAL | Status: DC

## 2011-01-08 MED ORDER — HYDROCODONE-ACETAMINOPHEN 5-325 MG PO TABS
1.0000 | ORAL_TABLET | Freq: Four times a day (QID) | ORAL | Status: DC | PRN
Start: 2011-01-08 — End: 2011-02-12

## 2011-01-08 MED ORDER — LEVOTHYROXINE SODIUM 112 MCG PO TABS: 112.0000 ug | ORAL_TABLET | Freq: Every day | ORAL | Status: DC

## 2011-01-08 MED ORDER — WARFARIN SODIUM 5 MG PO TABS: 5.0000 mg | ORAL_TABLET | Freq: Every day | ORAL | Status: DC

## 2011-01-08 MED ORDER — ROSUVASTATIN CALCIUM 5 MG PO TABS: 5.0000 mg | ORAL_TABLET | Freq: Every day | ORAL | Status: DC

## 2011-01-08 MED ORDER — FEXOFENADINE-PSEUDOEPHED ER 60-120 MG PO TB12: 1.0000 | ORAL_TABLET | Freq: Two times a day (BID) | ORAL | Status: DC

## 2011-01-08 NOTE — Patient Instructions (Signed)
Keep wound dry for the first 24 hours then clean daily with soap and water for one week. Apply topical antibiotic daily for 3-4 days. Keep covered with clean dressing for 4-5 days. Follow up promptly for any signs of infection such as redness, warmth, pain, or drainage.  

## 2011-01-08 NOTE — Progress Notes (Signed)
Subjective:    Patient ID: Tyler Atkinson, male    DOB: 11-02-1955, 55 y.o.   MRN: [id number]  HPI  Patient here for complete physical and to follow up/evaluate multiple medical problems. He has history of hypothyroidism, hyperlipidemia, history of deep vein thrombosis left upper extremity. Maintained on Coumadin. No clear risk factors for his DVT and patient has been reluctant to come off Coumadin though we gave him option after 6 months of treatment. No history of pulmonary embolus.  He's had some chronic left shoulder pain intermittently. Sometimes worse with abduction. No reported injury. No visible swelling.  No alleviating.  No weakness.  Left posterior thigh skin lesion which is scaly and scrapes off but tends to recur. Nonhealing over 3 months. Initially treated with liquid nitrogen but this keeps returning. No personal history of skin cancer.  Frequent bilateral lateral hip pains. Tender to palpation. Improved with swimming. No pain with ambulation.  Small stye right lower lid. Present for few weeks. No drainage. No blurred vision.   Review of Systems  Constitutional: Negative for fever, activity change, appetite change and fatigue.  HENT: Negative for ear pain, congestion and trouble swallowing.   Eyes: Negative for pain and visual disturbance.  Respiratory: Negative for cough, shortness of breath and wheezing.   Cardiovascular: Negative for chest pain and palpitations.  Gastrointestinal: Negative for nausea, vomiting, abdominal pain, diarrhea, constipation, blood in stool, abdominal distention and rectal pain.  Genitourinary: Negative for dysuria, hematuria and testicular pain.  Musculoskeletal: Negative for joint swelling and arthralgias.  Skin: Negative for rash.  Neurological: Negative for dizziness, syncope and headaches.  Hematological: Negative for adenopathy.  Psychiatric/Behavioral: Negative for confusion and dysphoric mood.       Objective:   Physical Exam    Constitutional: He is oriented to person, place, and time. He appears well-developed and well-nourished. No distress.  HENT:  Head: Normocephalic and atraumatic.  Right Ear: External ear normal.  Left Ear: External ear normal.  Mouth/Throat: Oropharynx is clear and moist.  Eyes: Conjunctivae and EOM are normal. Pupils are equal, round, and reactive to light.       Small stye right lower lid. No nodular changes  Neck: Normal range of motion. Neck supple. No thyromegaly present.  Cardiovascular: Normal rate, regular rhythm and normal heart sounds.   No murmur heard. Pulmonary/Chest: No respiratory distress. He has no wheezes. He has no rales.  Abdominal: Soft. Bowel sounds are normal. He exhibits no distension and no mass. There is no tenderness. There is no rebound and no guarding.  Musculoskeletal: He exhibits no edema.  Lymphadenopathy:    He has no cervical adenopathy.  Neurological: He is alert and oriented to person, place, and time. He displays normal reflexes. No cranial nerve deficit.  Skin: No rash noted.       Left posterior thigh reveals approximately 8-9 mm crusted slightly elevated nonpigmented skin lesion.  Psychiatric: He has a normal mood and affect.          Assessment & Plan:  #1 complete physical. Colonoscopy up to date. Tetanus up-to-date. Vaccine given. Discussed regular exercise. Labs reviewed with patient  #2 hyperlipidemia continue Crestor with refills given  #3 history of hypothyroidism adequately replaced. Continue levothyroxin with refills given  #4 Coumadin therapy secondary to DVT left upper extremity  #5 left shoulder pain. Given duration start with plain x-rays #6 nonhealing skin lesion left posterior thigh. Question irritated seborrheic keratosis vs other. Discussed risk and benefits of shave excision patient  consented.   Anesthetized 1% Xylocaine with epinephrine. Shave excisional #15 blade. Minimal bleeding. Controlled with Drysol. Specimen sent to  pathology. Wound care instruction given.

## 2011-01-13 NOTE — Progress Notes (Signed)
Quick Note:  Pt informed ______ 

## 2011-01-15 ENCOUNTER — Other Ambulatory Visit: Payer: Self-pay | Admitting: *Deleted

## 2011-01-15 MED ORDER — LEVOTHYROXINE SODIUM 112 MCG PO TABS: 112.0000 ug | ORAL_TABLET | Freq: Every day | ORAL | Status: DC

## 2011-02-05 ENCOUNTER — Ambulatory Visit (INDEPENDENT_AMBULATORY_CARE_PROVIDER_SITE_OTHER): Payer: 59 | Admitting: Family Medicine

## 2011-02-05 DIAGNOSIS — I82409 Acute embolism and thrombosis of unspecified deep veins of unspecified lower extremity: Secondary | ICD-10-CM

## 2011-02-05 NOTE — Patient Instructions (Signed)
  Latest dosing instructions   Total Sun Mon Tue Wed Thu Fri Sat   22.5 2.5 mg 5 mg 2.5 mg 2.5 mg 2.5 mg 5 mg 2.5 mg    (5 mg0.5) (5 mg1) (5 mg0.5) (5 mg0.5) (5 mg0.5) (5 mg1) (5 mg0.5)        

## 2011-02-11 ENCOUNTER — Other Ambulatory Visit: Payer: Self-pay

## 2011-02-11 NOTE — Telephone Encounter (Signed)
Refill once but would try to avoid regular use to avoid dependency.

## 2011-02-11 NOTE — Telephone Encounter (Signed)
Rx request for hydrocodone-acetaminophen 5-325. Last filled 01/08/11.  Pt last seen 01/08/11. Pls advise.

## 2011-02-12 MED ORDER — HYDROCODONE-ACETAMINOPHEN 5-325 MG PO TABS: 1.0000 | ORAL_TABLET | Freq: Four times a day (QID) | ORAL | Status: DC | PRN

## 2011-02-12 NOTE — Telephone Encounter (Signed)
Pt was informed he needs to avoid regular use.  He said Dr Caryl Never and him discussed this last month.  He is only using 30 pills a month.  Offered appt to discuss with Dr Caryl Never.  He will call again next month and see what happens

## 2011-03-05 ENCOUNTER — Ambulatory Visit (INDEPENDENT_AMBULATORY_CARE_PROVIDER_SITE_OTHER): Payer: 59 | Admitting: Family Medicine

## 2011-03-05 DIAGNOSIS — I82419 Acute embolism and thrombosis of unspecified femoral vein: Secondary | ICD-10-CM

## 2011-03-05 DIAGNOSIS — I82409 Acute embolism and thrombosis of unspecified deep veins of unspecified lower extremity: Secondary | ICD-10-CM

## 2011-03-05 DIAGNOSIS — I824Y9 Acute embolism and thrombosis of unspecified deep veins of unspecified proximal lower extremity: Secondary | ICD-10-CM

## 2011-03-05 LAB — POCT INR: INR: 3.3

## 2011-03-05 NOTE — Patient Instructions (Signed)
  Latest dosing instructions   Total Sun Mon Tue Wed Thu Fri Sat   20 2.5 mg 5 mg 2.5 mg 2.5 mg 2.5 mg 2.5 mg 2.5 mg    (5 mg0.5) (5 mg1) (5 mg0.5) (5 mg0.5) (5 mg0.5) (5 mg0.5) (5 mg0.5)

## 2011-03-13 ENCOUNTER — Other Ambulatory Visit: Payer: Self-pay | Admitting: *Deleted

## 2011-03-13 MED ORDER — WARFARIN SODIUM 5 MG PO TABS: 5.0000 mg | ORAL_TABLET | Freq: Every day | ORAL | Status: DC

## 2011-04-02 ENCOUNTER — Ambulatory Visit: Payer: 59 | Admitting: *Deleted

## 2011-04-02 DIAGNOSIS — Z5181 Encounter for therapeutic drug level monitoring: Secondary | ICD-10-CM

## 2011-04-02 DIAGNOSIS — I82419 Acute embolism and thrombosis of unspecified femoral vein: Secondary | ICD-10-CM

## 2011-04-02 DIAGNOSIS — Z7901 Long term (current) use of anticoagulants: Secondary | ICD-10-CM

## 2011-04-02 DIAGNOSIS — I82409 Acute embolism and thrombosis of unspecified deep veins of unspecified lower extremity: Secondary | ICD-10-CM

## 2011-04-02 LAB — POCT INR: INR: 2.3

## 2011-04-02 NOTE — Patient Instructions (Signed)
  Latest dosing instructions   Total Sun Mon Tue Wed Thu Fri Sat   20 2.5 mg 5 mg 2.5 mg 2.5 mg 2.5 mg 2.5 mg 2.5 mg    (5 mg0.5) (5 mg1) (5 mg0.5) (5 mg0.5) (5 mg0.5) (5 mg0.5) (5 mg0.5)        

## 2011-04-12 ENCOUNTER — Other Ambulatory Visit: Payer: Self-pay | Admitting: Family Medicine

## 2011-04-14 ENCOUNTER — Encounter: Payer: Self-pay | Admitting: Family Medicine

## 2011-04-14 ENCOUNTER — Ambulatory Visit (INDEPENDENT_AMBULATORY_CARE_PROVIDER_SITE_OTHER): Payer: 59 | Admitting: Family Medicine

## 2011-04-14 VITALS — BP 160/90 | Temp 98.0°F | Wt 177.0 lb

## 2011-04-14 DIAGNOSIS — R202 Paresthesia of skin: Secondary | ICD-10-CM

## 2011-04-14 DIAGNOSIS — M549 Dorsalgia, unspecified: Secondary | ICD-10-CM

## 2011-04-14 DIAGNOSIS — R209 Unspecified disturbances of skin sensation: Secondary | ICD-10-CM

## 2011-04-14 DIAGNOSIS — M546 Pain in thoracic spine: Secondary | ICD-10-CM

## 2011-04-14 MED ORDER — HYDROCODONE-ACETAMINOPHEN 5-325 MG PO TABS: 1.0000 | ORAL_TABLET | Freq: Four times a day (QID) | ORAL | Status: DC | PRN

## 2011-04-14 MED ORDER — CYCLOBENZAPRINE HCL 5 MG PO TABS: 5.0000 mg | ORAL_TABLET | Freq: Three times a day (TID) | ORAL | Status: AC | PRN

## 2011-04-14 NOTE — Progress Notes (Signed)
  Subjective:    Patient ID: Tyler Atkinson, male    DOB: [birth year], 56 y.o.   MRN: [id number]  HPI  Patient seen with possible muscle strain left upper back. This occurred Saturday. He was driving his truck and looking around behind him when he noticed a popping sensation left periscapular region. Since that time he had localized tightness and pain left upper back along with some radiation of pain down left upper extremity. Has occasional tingling sensation left index finger and middle finger. No definite weakness. He describes several months of intermittent left shoulder pain at night with rest and possible weakness left upper extremity. He tried heat with minimal relief upper back pain.  He takes Coumadin for history of left upper exremity DVT. No recent bleeding complications. Has supplemented with hydrocodone which takes for some chronic arthritic pains and this has helped his left upper back somewhat  Past Medical History  Diagnosis Date  . Thyroid cancer   . Depression   . Hay fever     allergies  . DVT (deep vein thrombosis) in pregnancy     LUE 3/11  . Hypothyroidism   . Hyperlipidemia    Past Surgical History  Procedure Date  . Thyroidectomy     2000  . Colonoscopy     01/27/2006. Normal    reports that he quit smoking about 26 years ago. He does not have any smokeless tobacco history on file. His alcohol and drug histories not on file. family history includes Breast cancer in an unspecified family member; Colon cancer in an unspecified family member; Diabetes in an unspecified family member; Heart disease in an unspecified family member; Hypertension in an unspecified family member; and Stroke in an unspecified family member. Allergies  Allergen Reactions  . Ezetimibe-Simvastatin     REACTION: rash X 2-3 months      Review of Systems  Respiratory: Negative for cough and shortness of breath.   Cardiovascular: Negative for chest pain, palpitations and leg swelling.    Gastrointestinal: Negative for abdominal pain.  Musculoskeletal: Positive for back pain.  Hematological: Negative for adenopathy. Does not bruise/bleed easily.       Objective:   Physical Exam  Constitutional: He is oriented to person, place, and time. He appears well-developed and well-nourished.  Cardiovascular: Normal rate and regular rhythm.   Pulmonary/Chest: Effort normal and breath sounds normal. No respiratory distress. He has no wheezes. He has no rales.  Musculoskeletal: He exhibits no edema.       Left upper extremity reveals no edema. Good distal pulses. No muscle atrophy. Full range of motion left shoulder  Neurological: He is alert and oriented to person, place, and time. No cranial nerve deficit.       Deep tendon reflexes 2+ and symmetric upper extremities. No focal strength deficits. Normal sensory function to touch          Assessment & Plan:   left upper back pain. Suspect muscular. He has palpable muscular tension in this region. Try moist heat. Muscle massage. Low-dose Flexeril 5 mg each bedtime  Dysesthesias left thumb index and middle finger. Doubt related upper back strain. This sounds more likely cervical radiculopathy. No symptoms to suggest carpal tunnel.  Consider cervical spine films if symptoms persist

## 2011-04-14 NOTE — Patient Instructions (Signed)
Try heat topically Consider muscle massage Muscle relaxer at night

## 2011-04-30 ENCOUNTER — Ambulatory Visit (INDEPENDENT_AMBULATORY_CARE_PROVIDER_SITE_OTHER): Payer: 59 | Admitting: Family Medicine

## 2011-04-30 DIAGNOSIS — I82419 Acute embolism and thrombosis of unspecified femoral vein: Secondary | ICD-10-CM

## 2011-04-30 DIAGNOSIS — I824Y9 Acute embolism and thrombosis of unspecified deep veins of unspecified proximal lower extremity: Secondary | ICD-10-CM

## 2011-04-30 DIAGNOSIS — I82409 Acute embolism and thrombosis of unspecified deep veins of unspecified lower extremity: Secondary | ICD-10-CM

## 2011-04-30 LAB — POCT INR: INR: 2.9

## 2011-04-30 NOTE — Patient Instructions (Signed)
  Latest dosing instructions   Total Sun Mon Tue Wed Thu Fri Sat   20 2.5 mg 5 mg 2.5 mg 2.5 mg 2.5 mg 2.5 mg 2.5 mg    (5 mg0.5) (5 mg1) (5 mg0.5) (5 mg0.5) (5 mg0.5) (5 mg0.5) (5 mg0.5)        

## 2011-06-04 ENCOUNTER — Ambulatory Visit: Payer: 59 | Admitting: *Deleted

## 2011-06-04 DIAGNOSIS — I82419 Acute embolism and thrombosis of unspecified femoral vein: Secondary | ICD-10-CM

## 2011-06-04 DIAGNOSIS — I82409 Acute embolism and thrombosis of unspecified deep veins of unspecified lower extremity: Secondary | ICD-10-CM

## 2011-06-04 LAB — POCT INR: INR: 2.5

## 2011-06-04 NOTE — Patient Instructions (Signed)
  Latest dosing instructions   Total Sun Mon Tue Wed Thu Fri Sat   20 2.5 mg 5 mg 2.5 mg 2.5 mg 2.5 mg 2.5 mg 2.5 mg    (5 mg0.5) (5 mg1) (5 mg0.5) (5 mg0.5) (5 mg0.5) (5 mg0.5) (5 mg0.5)

## 2011-07-08 ENCOUNTER — Other Ambulatory Visit: Payer: Self-pay | Admitting: Family Medicine

## 2011-07-17 ENCOUNTER — Ambulatory Visit (INDEPENDENT_AMBULATORY_CARE_PROVIDER_SITE_OTHER): Payer: 59 | Admitting: Family

## 2011-07-17 DIAGNOSIS — I82409 Acute embolism and thrombosis of unspecified deep veins of unspecified lower extremity: Secondary | ICD-10-CM

## 2011-07-17 NOTE — Patient Instructions (Addendum)
Take extra half tablet today. Then continue the same dose, 5 mg only on mondays  2.5 mg on other days. Patient request 4 weeks     Latest dosing instructions   Total Sun Mon Tue Wed Thu Fri Sat   20 2.5 mg 5 mg 2.5 mg 2.5 mg 2.5 mg 2.5 mg 2.5 mg    (5 mg0.5) (5 mg1) (5 mg0.5) (5 mg0.5) (5 mg0.5) (5 mg0.5) (5 mg0.5)

## 2011-07-30 ENCOUNTER — Other Ambulatory Visit: Payer: Self-pay | Admitting: *Deleted

## 2011-07-30 MED ORDER — HYDROCODONE-ACETAMINOPHEN 5-325 MG PO TABS: 1.0000 | ORAL_TABLET | Freq: Four times a day (QID) | ORAL | Status: DC | PRN

## 2011-07-30 NOTE — Telephone Encounter (Signed)
Refill okay?  

## 2011-07-30 NOTE — Telephone Encounter (Signed)
Hydrocodone 5-325 last filled 04-14-11, #30 with 1 refill

## 2011-08-11 ENCOUNTER — Telehealth: Payer: Self-pay | Admitting: Family Medicine

## 2011-08-11 NOTE — Telephone Encounter (Signed)
Requesting refill on   HYDROcodone-acetaminophen (NORCO) 5-325 MG per tablet   [facility]

## 2011-08-12 NOTE — Telephone Encounter (Signed)
This was called in last week, #30 with 1 refills, pharmacist confirmed.  She will call pt.

## 2011-08-14 ENCOUNTER — Ambulatory Visit (INDEPENDENT_AMBULATORY_CARE_PROVIDER_SITE_OTHER): Payer: 59 | Admitting: Family

## 2011-08-14 DIAGNOSIS — I82409 Acute embolism and thrombosis of unspecified deep veins of unspecified lower extremity: Secondary | ICD-10-CM

## 2011-08-14 DIAGNOSIS — I824Y9 Acute embolism and thrombosis of unspecified deep veins of unspecified proximal lower extremity: Secondary | ICD-10-CM

## 2011-08-14 DIAGNOSIS — I82419 Acute embolism and thrombosis of unspecified femoral vein: Secondary | ICD-10-CM

## 2011-09-11 ENCOUNTER — Ambulatory Visit (INDEPENDENT_AMBULATORY_CARE_PROVIDER_SITE_OTHER): Payer: 59 | Admitting: Family

## 2011-09-11 DIAGNOSIS — I82409 Acute embolism and thrombosis of unspecified deep veins of unspecified lower extremity: Secondary | ICD-10-CM

## 2011-09-11 DIAGNOSIS — I82419 Acute embolism and thrombosis of unspecified femoral vein: Secondary | ICD-10-CM

## 2011-09-11 DIAGNOSIS — I824Y9 Acute embolism and thrombosis of unspecified deep veins of unspecified proximal lower extremity: Secondary | ICD-10-CM

## 2011-09-11 NOTE — Patient Instructions (Signed)
Tomorrow only, take 1/2 tab. Eat a few more servings of greens this week. Then continue 5 mg only on mondays and Friday (1 whole tab).  2.5 mg (1/2) on other days. Patient request 4 weeks.     Latest dosing instructions   Total Sun Mon Tue Wed Thu Fri Sat   22.5 2.5 mg 5 mg 2.5 mg 2.5 mg 2.5 mg 5 mg 2.5 mg    (5 mg0.5) (5 mg1) (5 mg0.5) (5 mg0.5) (5 mg0.5) (5 mg1) (5 mg0.5)

## 2011-10-09 ENCOUNTER — Ambulatory Visit (INDEPENDENT_AMBULATORY_CARE_PROVIDER_SITE_OTHER): Payer: 59 | Admitting: Family

## 2011-10-09 DIAGNOSIS — I82409 Acute embolism and thrombosis of unspecified deep veins of unspecified lower extremity: Secondary | ICD-10-CM

## 2011-10-09 LAB — POCT INR: INR: 3.3

## 2011-10-09 NOTE — Patient Instructions (Signed)
Friday only of this week, do not take coumadin. Eat a few more servings of greens this week. Then continue 5 mg only on mondays and Friday (1 whole tab).  2.5 mg (1/2) on other days. Patient request 4 weeks.     Latest dosing instructions   Total Sun Mon Tue Wed Thu Fri Sat   22.5 2.5 mg 5 mg 2.5 mg 2.5 mg 2.5 mg 5 mg 2.5 mg    (5 mg0.5) (5 mg1) (5 mg0.5) (5 mg0.5) (5 mg0.5) (5 mg1) (5 mg0.5)

## 2011-11-06 ENCOUNTER — Ambulatory Visit (INDEPENDENT_AMBULATORY_CARE_PROVIDER_SITE_OTHER): Payer: 59 | Admitting: Family

## 2011-11-06 DIAGNOSIS — I824Y9 Acute embolism and thrombosis of unspecified deep veins of unspecified proximal lower extremity: Secondary | ICD-10-CM

## 2011-11-06 DIAGNOSIS — Z23 Encounter for immunization: Secondary | ICD-10-CM

## 2011-11-06 DIAGNOSIS — I82419 Acute embolism and thrombosis of unspecified femoral vein: Secondary | ICD-10-CM

## 2011-11-06 DIAGNOSIS — I82409 Acute embolism and thrombosis of unspecified deep veins of unspecified lower extremity: Secondary | ICD-10-CM

## 2011-11-06 LAB — POCT INR: INR: 2.5

## 2011-11-06 NOTE — Patient Instructions (Signed)
Continue 5 mg only on mondays and Friday (1 whole tab).  2.5 mg (1/2) on other days. Patient request 4 weeks.    Latest dosing instructions   Total Sun Mon Tue Wed Thu Fri Sat   22.5 2.5 mg 5 mg 2.5 mg 2.5 mg 2.5 mg 5 mg 2.5 mg    (5 mg0.5) (5 mg1) (5 mg0.5) (5 mg0.5) (5 mg0.5) (5 mg1) (5 mg0.5)        

## 2011-11-19 ENCOUNTER — Other Ambulatory Visit: Payer: Self-pay | Admitting: Family Medicine

## 2011-11-19 MED ORDER — HYDROCODONE-ACETAMINOPHEN 5-325 MG PO TABS: 1.0000 | ORAL_TABLET | Freq: Four times a day (QID) | ORAL | Status: DC | PRN

## 2011-11-19 NOTE — Telephone Encounter (Signed)
Pt needs refill hydrocodone call into [facility]

## 2011-11-19 NOTE — Telephone Encounter (Signed)
Refill once 

## 2011-11-19 NOTE — Addendum Note (Signed)
Addended by: Melchor Amour on: 11/19/2011 01:11 PM   Modules accepted: Orders

## 2011-11-19 NOTE — Telephone Encounter (Signed)
Pt has CPX scheduled for 12/17, hydrocodone last filled #30 with 1 refill on 07-30-11

## 2011-12-04 ENCOUNTER — Ambulatory Visit (INDEPENDENT_AMBULATORY_CARE_PROVIDER_SITE_OTHER): Payer: 59 | Admitting: Family

## 2011-12-04 DIAGNOSIS — I82409 Acute embolism and thrombosis of unspecified deep veins of unspecified lower extremity: Secondary | ICD-10-CM

## 2011-12-04 LAB — POCT INR: INR: 3.2

## 2011-12-04 NOTE — Patient Instructions (Addendum)
Take 1/2 tab on this Friday only.  Continue 5 mg only on mondays and Friday (1 whole tab).  2.5 mg (1/2) on other days. Patient request 4 weeks.     Latest dosing instructions   Total Sun Mon Tue Wed Thu Fri Sat   22.5 2.5 mg 5 mg 2.5 mg 2.5 mg 2.5 mg 5 mg 2.5 mg    (5 mg0.5) (5 mg1) (5 mg0.5) (5 mg0.5) (5 mg0.5) (5 mg1) (5 mg0.5)

## 2011-12-16 ENCOUNTER — Other Ambulatory Visit: Payer: Self-pay | Admitting: Family Medicine

## 2012-01-01 ENCOUNTER — Ambulatory Visit: Payer: 59 | Admitting: Family

## 2012-01-01 ENCOUNTER — Ambulatory Visit (INDEPENDENT_AMBULATORY_CARE_PROVIDER_SITE_OTHER): Payer: 59 | Admitting: Family

## 2012-01-01 ENCOUNTER — Other Ambulatory Visit (INDEPENDENT_AMBULATORY_CARE_PROVIDER_SITE_OTHER): Payer: 59

## 2012-01-01 DIAGNOSIS — I82409 Acute embolism and thrombosis of unspecified deep veins of unspecified lower extremity: Secondary | ICD-10-CM

## 2012-01-01 DIAGNOSIS — Z0000 Encounter for general adult medical examination without abnormal findings: Secondary | ICD-10-CM

## 2012-01-01 LAB — CBC WITH DIFFERENTIAL/PLATELET
Eosinophils Absolute: 0.2 10*3/uL (ref 0.0–0.7)
Eosinophils Relative: 3.1 % (ref 0.0–5.0)
Lymphocytes Relative: 30.6 % (ref 12.0–46.0)
MCV: 95.7 fl (ref 78.0–100.0)
Monocytes Absolute: 0.8 10*3/uL (ref 0.1–1.0)
Neutrophils Relative %: 54 % (ref 43.0–77.0)
Platelets: 268 10*3/uL (ref 150.0–400.0)
WBC: 6.7 10*3/uL (ref 4.5–10.5)

## 2012-01-01 LAB — TSH: TSH: 0.07 u[IU]/mL — ABNORMAL LOW (ref 0.35–5.50)

## 2012-01-01 LAB — BASIC METABOLIC PANEL
BUN: 15 mg/dL (ref 6–23)
Creatinine, Ser: 0.7 mg/dL (ref 0.4–1.5)
GFR: 119.15 mL/min (ref >60.00)
Potassium: 4.5 mEq/L (ref 3.5–5.1)

## 2012-01-01 LAB — POCT URINALYSIS DIPSTICK
Bilirubin, UA: NEGATIVE
Glucose, UA: NEGATIVE
Ketones, UA: NEGATIVE
Spec Grav, UA: 1.015

## 2012-01-01 LAB — POCT INR: INR: 2.9

## 2012-01-01 LAB — HEPATIC FUNCTION PANEL
AST: 26 U/L (ref 0–37)
Alkaline Phosphatase: 73 U/L (ref 39–117)
Bilirubin, Direct: 0.1 mg/dL (ref 0.0–0.3)
Total Bilirubin: 0.7 mg/dL (ref 0.3–1.2)

## 2012-01-01 LAB — LIPID PANEL: Cholesterol: 202 mg/dL — ABNORMAL HIGH (ref 0–200)

## 2012-01-01 LAB — LDL CHOLESTEROL, DIRECT: Direct LDL: 125.6 mg/dL

## 2012-01-01 NOTE — Patient Instructions (Addendum)
Continue 5 mg only on mondays and Friday (1 whole tab).  2.5 mg (1/2) on other days. Patient request 4 weeks.    Latest dosing instructions   Total Sun Mon Tue Wed Thu Fri Sat   22.5 2.5 mg 5 mg 2.5 mg 2.5 mg 2.5 mg 5 mg 2.5 mg    (5 mg0.5) (5 mg1) (5 mg0.5) (5 mg0.5) (5 mg0.5) (5 mg1) (5 mg0.5)        

## 2012-01-13 ENCOUNTER — Encounter: Payer: Self-pay | Admitting: Family Medicine

## 2012-01-13 ENCOUNTER — Ambulatory Visit (INDEPENDENT_AMBULATORY_CARE_PROVIDER_SITE_OTHER): Payer: 59 | Admitting: Family Medicine

## 2012-01-13 VITALS — BP 130/70 | HR 88 | Temp 98.3°F | Resp 12 | Ht 70.75 in | Wt 178.0 lb

## 2012-01-13 DIAGNOSIS — L409 Psoriasis, unspecified: Secondary | ICD-10-CM

## 2012-01-13 DIAGNOSIS — R319 Hematuria, unspecified: Secondary | ICD-10-CM

## 2012-01-13 DIAGNOSIS — E039 Hypothyroidism, unspecified: Secondary | ICD-10-CM

## 2012-01-13 DIAGNOSIS — I82409 Acute embolism and thrombosis of unspecified deep veins of unspecified lower extremity: Secondary | ICD-10-CM

## 2012-01-13 DIAGNOSIS — L408 Other psoriasis: Secondary | ICD-10-CM

## 2012-01-13 DIAGNOSIS — E785 Hyperlipidemia, unspecified: Secondary | ICD-10-CM

## 2012-01-13 DIAGNOSIS — M7700 Medial epicondylitis, unspecified elbow: Secondary | ICD-10-CM

## 2012-01-13 DIAGNOSIS — Z0000 Encounter for general adult medical examination without abnormal findings: Secondary | ICD-10-CM

## 2012-01-13 MED ORDER — LEVOTHYROXINE SODIUM 100 MCG PO TABS: 100.0000 ug | ORAL_TABLET | Freq: Every day | ORAL | Status: DC

## 2012-01-13 MED ORDER — HYDROCODONE-ACETAMINOPHEN 5-325 MG PO TABS: 1.0000 | ORAL_TABLET | Freq: Four times a day (QID) | ORAL | Status: DC | PRN

## 2012-01-13 MED ORDER — METHYLPREDNISOLONE ACETATE 80 MG/ML IJ SUSP
80.0000 mg | Freq: Once | INTRAMUSCULAR | Status: AC
  Administered 2012-01-13: 80 mg via INTRAMUSCULAR

## 2012-01-13 MED ORDER — DESONIDE 0.05 % EX CREA: TOPICAL_CREAM | Freq: Two times a day (BID) | CUTANEOUS | Status: DC

## 2012-01-13 NOTE — Progress Notes (Signed)
Subjective:    Patient ID: Tyler Atkinson, male    DOB: July 26, 1955, 56 y.o.   MRN: [id number]  HPI  Patient here for physical and to discuss several other issues as below. His past medical history significant for prior DVT upper extremity, hyperlipidemia, history of depression, hypothyroidism. Medications reviewed. Remains on Coumadin for prior history of DVT. He is reluctant to discontinue. No recent bleeding complications. Hyperlipidemia treated with Crestor 5 mg daily. He takes Cialis for erectile dysfunction. Compliant with thyroid therapy.  Immunizations up to date. He has questions regarding shingles vaccine and is not sure of insurance coverage. Nonsmoker.  Plaque-like rash which is slightly pruritic mostly upper extremities. No prior diagnosis of psoriasis. No family history of psoriasis. Onset months ago with no prior dx psoriasis.  Intermittent right elbow pain. Location is medial epicondylar region. No injury. Worse with gripping. No weakness. No alleviating factors. Symptoms relatively mild at this point in time.  Previously noted skin lesion upper thigh. Treated initially liquid nitrogen and did not resolve. Subsequent shave excision with pathology verruca vulgaris. This has re- grown. It itches occasionally.  Past Medical History  Diagnosis Date  . Thyroid cancer   . Depression   . Hay fever     allergies  . Hypothyroidism   . Hyperlipidemia   . DVT of upper extremity (deep vein thrombosis) 3/11   Past Surgical History  Procedure Date  . Thyroidectomy     2000  . Colonoscopy     01/27/2006. Normal    reports that he quit smoking about 26 years ago. He does not have any smokeless tobacco history on file. His alcohol and drug histories not on file. family history includes Breast cancer in an unspecified family member; Colon cancer in an unspecified family member; Diabetes in an unspecified family member; Heart disease in an unspecified family member; Hypertension in an  unspecified family member; and Stroke in an unspecified family member. Allergies  Allergen Reactions  . Ezetimibe-Simvastatin     REACTION: rash X 2-3 months      Review of Systems  Constitutional: Negative for fever, activity change, appetite change and fatigue.  HENT: Negative for ear pain, congestion and trouble swallowing.   Eyes: Negative for pain and visual disturbance.  Respiratory: Negative for cough, shortness of breath and wheezing.   Cardiovascular: Negative for chest pain and palpitations.  Gastrointestinal: Negative for nausea, vomiting, abdominal pain, diarrhea, constipation, blood in stool, abdominal distention and rectal pain.  Genitourinary: Negative for dysuria, hematuria and testicular pain.  Musculoskeletal: Negative for joint swelling and arthralgias.  Skin: Positive for rash.  Neurological: Negative for dizziness, syncope and headaches.  Hematological: Negative for adenopathy. Does not bruise/bleed easily.  Psychiatric/Behavioral: Negative for confusion and dysphoric mood.       Objective:   Physical Exam  Constitutional: He is oriented to person, place, and time. He appears well-developed and well-nourished.  HENT:  Right Ear: External ear normal.  Left Ear: External ear normal.  Mouth/Throat: Oropharynx is clear and moist.  Neck: Neck supple. No thyromegaly present.  Cardiovascular: Normal rate and regular rhythm.   Pulmonary/Chest: Effort normal and breath sounds normal. No respiratory distress. He has no wheezes. He has no rales.  Abdominal: Soft. Bowel sounds are normal. He exhibits no distension and no mass. There is no tenderness. There is no rebound and no guarding.  Musculoskeletal: He exhibits no edema.       Patient some mild tenderness right medial epicondylar region. Full range of  motion elbow. Pain with wrist flexion against resistance but not with extension against resistance  Lymphadenopathy:    He has no cervical adenopathy.   Neurological: He is alert and oriented to person, place, and time. No cranial nerve deficit.  Skin: Rash noted.       Patient has fairly classic psoriasis type rash. Erythematous base and thick silvery scale well-demarcated border most predominant forearms and elbows  Psychiatric: He has a normal mood and affect. His behavior is normal.          Assessment & Plan:  #1 health maintenance. Previous colonoscopy about 6 years ago normal. Patient will check on coverage for shingles vaccine. Other immunizations up to date. #2 hypothyroidism. Over replaced with TSH 0.07. Decreased dosage of levothyroxin and recheck TSH in 3 months #3 psoriasis. Patient requesting Depo-Medrol and 80 mg given though would not recommend regular use. Desowen cream use twice daily as needed no longer than 2 weeks continuously. We discussed possible need for dermatology referral this point he wishes to try topical first #4 hematuria on urine dipstick. Only trace hematuria. Patient unable to give followup urine today. At followup lab in January repeat urine and if still is trace blood on dipstick or greater send for urine micro- #5 medial epicondylitis right elbow. Reassurance. He'll try some icing. Avoidance nonsteroidals on Coumadin #6 skin lesion upper thigh. Previous pathology verruca vulgaris. This returned following liquid nitrogen and shave excision. We discussed elliptical excision he'll return later for that sometime early in the new year

## 2012-01-13 NOTE — Patient Instructions (Addendum)
Psoriasis Psoriasis is a common, long-lasting (chronic) inflammation of the skin. It affects both men and women equally, of all ages and all races. Psoriasis cannot be passed from person to person (not contagious). Psoriasis varies from mild to very severe. When severe, it can greatly affect your quality of life. Psoriasis is an inflammatory disorder affecting the skin as well as other organs including the joints (causing an arthritis). With psoriasis, the skin sheds its top layer of cells more rapidly than it does in someone without psoriasis. CAUSES  The cause of psoriasis is largely unknown. Genetics, your immune system, and the environment seem to play a role in causing psoriasis. Factors that can make psoriasis worse include:  Damage or trauma to the skin, such as cuts, scrapes, and sunburn. This damage often causes new areas of psoriasis (lesions).  Winter dryness and lack of sunlight.  Medicines such as lithium, beta-blockers, antimalarial drugs, ACE inhibitors, nonsteroidal anti-inflammatory drugs (ibuprofen, aspirin), and terbinafine. Let your caregiver know if you are taking any of these drugs.  Alcohol. Excessive alcohol use should be avoided if you have psoriasis. Drinking large amounts of alcohol can affect:  How well your psoriasis treatment works.  How safe your psoriasis treatment is.  Smoking. If you smoke, ask your caregiver for help to quit.  Stress.  Bacterial or viral infections.  Arthritis. Arthritis associated with psoriasis (psoriatic arthritis) affects less than 10% of patients with psoriasis. The arthritic intensity does not always match the skin psoriasis intensity. It is important to let your caregiver know if your joints hurt or if they are stiff. SYMPTOMS  The most common form of psoriasis begins with little red bumps that gradually become larger. The bumps begin to form scales that flake off easily. The lower layers of scales stick together. When these scales  are scratched or removed, the underlying skin is tender and bleeds easily. These areas then grow in size and may become large. Psoriasis often creates a rash that looks the same on both sides of the body (symmetrical). It often affects the elbows, knees, groin, genitals, arms, legs, scalp, and nails. Affected nails often have pitting, loosen, thicken, crumble, and are difficult to treat.  "Inverse psoriasis"occurs in the armpits, under breasts, in skin folds, and around the groin, buttocks, and genitals.  "Guttate psoriasis" generally occurs in children and young adults following a recent sore throat (strep throat). It begins with many small, red, scaly spots on the skin. It clears spontaneously in weeks or a few months without treatment. DIAGNOSIS  Psoriasis is diagnosed by physical exam. A tissue sample (biopsy) may also be taken. TREATMENT The treatment of psoriasis depends on your age, health, and living conditions.  Steroid (cortisone) creams, lotions, and ointments may be used. These treatments are associated with thinning of the skin, blood vessels that get larger (dilated), loss of skin pigmentation, and easy bruising. It is important to use these steroids as directed by your caregiver. Only treat the affected areas and not the normal, unaffected skin. People on long-term steroid treatment should wear a medical alert bracelet. Injections may be used in areas that are difficult to treat.  Scalp treatments are available as shampoos, solutions, sprays, foams, and oils. Avoid scratching the scalp and picking at the scales.  Anthralin medicine works well on areas that are difficult to treat. However, it stains clothes and skin and may cause temporary irritation.  Synthetic vitamin D (calcipotriene)can be used on small areas. It is available by prescription. The forms   of synthetic vitamin D available in health food stores do not help with psoriasis.  Coal tarsare available in various strengths  for psoriasis that is difficult to treat. They are one of the longest used treatments for difficult to treat psoriasis. However, they are messy to use.  Light therapy (UV therapy) can be carefully and professionally monitored in a dermatologist's office. Careful sunbathing is helpful for many people as directed by your caregiver. The exposure should be just long enough to cause a mild redness (erythema) of your skin. Avoid sunburn as this may make the condition worse. Sunscreen (SPF of 30 or higher) should be used to protect against sunburn. Cataracts, wrinkles, and skin aging are some of the harmful side effects of light therapy.  If creams (topical medicines) fail, there are several other options for systemic or oral medicines your caregiver can suggest. Psoriasis can sometimes be very difficult to treat. It can come and go. It is necessary to follow up with your caregiver regularly if your psoriasis is difficult to treat. Usually, with persistence you can get a good amount of relief. Maintaining consistent care is important. Do not change caregivers just because you do not see immediate results. It may take several trials to find the right combination of treatment for you. PREVENTING FLARE-UPS  Wear gloves while you wash dishes, while cleaning, and when you are outside in the cold.  If you have radiators, place a bowl of water or damp towel on the radiator. This will help put water back in the air. You can also use a humidifier to keep the air moist. Try to keep the humidity at about 60% in your home.  Apply moisturizer while your skin is still damp from bathing or showering. This traps water in the skin.  Avoid long, hot baths or showers. Keep soap use to a minimum. Soaps dry out the skin and wash away the protective oils. Use a fragrance free, dye free soap.  Drink enough water and fluids to keep your urine clear or pale yellow. Not drinking enough water depletes your skin's water  supply.  Turn off the heat at night and keep it low during the day. Cool air is less drying. SEEK MEDICAL CARE IF:  You have increasing pain in the affected areas.  You have uncontrolled bleeding in the affected areas.  You have increasing redness or warmth in the affected areas.  You start to have pain or stiffness in your joints.  You start feeling depressed about your condition.  You have a fever. Document Released: 01/11/2000 Document Revised: 04/07/2011 Document Reviewed: 07/08/2010 [facility] Patient Information 2013 [place], [place].  Repeat urine at follow up in January Check in insurance coverage for shingles vaccine.

## 2012-01-29 ENCOUNTER — Encounter: Payer: 59 | Admitting: Family

## 2012-02-05 ENCOUNTER — Ambulatory Visit (INDEPENDENT_AMBULATORY_CARE_PROVIDER_SITE_OTHER): Payer: 59 | Admitting: Family

## 2012-02-05 DIAGNOSIS — I82409 Acute embolism and thrombosis of unspecified deep veins of unspecified lower extremity: Secondary | ICD-10-CM

## 2012-02-05 DIAGNOSIS — I824Y9 Acute embolism and thrombosis of unspecified deep veins of unspecified proximal lower extremity: Secondary | ICD-10-CM

## 2012-02-05 DIAGNOSIS — I82419 Acute embolism and thrombosis of unspecified femoral vein: Secondary | ICD-10-CM

## 2012-02-05 NOTE — Patient Instructions (Addendum)
Continue 5 mg only on mondays and Friday (1 whole tab).  2.5 mg (1/2) on other days. Patient request 4 weeks.    Latest dosing instructions   Total Sun Mon Tue Wed Thu Fri Sat   22.5 2.5 mg 5 mg 2.5 mg 2.5 mg 2.5 mg 5 mg 2.5 mg    (5 mg0.5) (5 mg1) (5 mg0.5) (5 mg0.5) (5 mg0.5) (5 mg1) (5 mg0.5)

## 2012-02-14 ENCOUNTER — Other Ambulatory Visit: Payer: Self-pay | Admitting: Family Medicine

## 2012-03-04 ENCOUNTER — Ambulatory Visit (INDEPENDENT_AMBULATORY_CARE_PROVIDER_SITE_OTHER): Payer: 59 | Admitting: Family

## 2012-03-04 DIAGNOSIS — I82409 Acute embolism and thrombosis of unspecified deep veins of unspecified lower extremity: Secondary | ICD-10-CM

## 2012-03-04 NOTE — Patient Instructions (Addendum)
Take an extra 1/2 tablet today only. Continue 5 mg only on mondays and Friday (1 whole tab).  2.5 mg (1/2) on other days. Patient request 4 weeks.     Latest dosing instructions   Total Sun Mon Tue Wed Thu Fri Sat   22.5 2.5 mg 5 mg 2.5 mg 2.5 mg 2.5 mg 5 mg 2.5 mg    (5 mg0.5) (5 mg1) (5 mg0.5) (5 mg0.5) (5 mg0.5) (5 mg1) (5 mg0.5)

## 2012-04-01 ENCOUNTER — Ambulatory Visit (INDEPENDENT_AMBULATORY_CARE_PROVIDER_SITE_OTHER): Payer: 59 | Admitting: Family

## 2012-04-01 DIAGNOSIS — I824Y9 Acute embolism and thrombosis of unspecified deep veins of unspecified proximal lower extremity: Secondary | ICD-10-CM

## 2012-04-01 DIAGNOSIS — I82419 Acute embolism and thrombosis of unspecified femoral vein: Secondary | ICD-10-CM

## 2012-04-01 DIAGNOSIS — I82409 Acute embolism and thrombosis of unspecified deep veins of unspecified lower extremity: Secondary | ICD-10-CM

## 2012-04-01 LAB — POCT INR: INR: 2.7

## 2012-04-01 NOTE — Patient Instructions (Signed)
Continue 5 mg only on mondays and Friday (1 whole tab).  2.5 mg (1/2) on other days. Patient request 5 weeks.   Anticoagulation Dose Instructions as of 04/01/2012     Glynis Smiles Tue Wed Thu Fri Sat   New Dose 2.5 mg 5 mg 2.5 mg 2.5 mg 2.5 mg 5 mg 2.5 mg    Description       Continue 5 mg only on mondays and Friday (1 whole tab).  2.5 mg (1/2) on other days. Patient request 5 weeks.

## 2012-04-15 ENCOUNTER — Telehealth: Payer: Self-pay | Admitting: Family Medicine

## 2012-04-15 MED ORDER — HYDROCODONE-ACETAMINOPHEN 5-325 MG PO TABS: 1.0000 | ORAL_TABLET | Freq: Four times a day (QID) | ORAL | Status: DC | PRN

## 2012-04-15 NOTE — Telephone Encounter (Signed)
Refill once.  Try to avoid regular use if possible.

## 2012-04-15 NOTE — Telephone Encounter (Signed)
Hydrocodone last filled at [facility] on 01-13-12, #30 with 1 refill

## 2012-04-15 NOTE — Telephone Encounter (Signed)
Pt called and stated that he needed a refill of his HYDROcodone-acetaminophen (NORCO/VICODIN) 5-325 MG per tablet. Please assist.

## 2012-05-06 ENCOUNTER — Ambulatory Visit (INDEPENDENT_AMBULATORY_CARE_PROVIDER_SITE_OTHER): Payer: 59 | Admitting: Family

## 2012-05-06 DIAGNOSIS — I82409 Acute embolism and thrombosis of unspecified deep veins of unspecified lower extremity: Secondary | ICD-10-CM

## 2012-05-06 DIAGNOSIS — I82401 Acute embolism and thrombosis of unspecified deep veins of right lower extremity: Secondary | ICD-10-CM

## 2012-05-06 DIAGNOSIS — I82419 Acute embolism and thrombosis of unspecified femoral vein: Secondary | ICD-10-CM

## 2012-05-06 DIAGNOSIS — I824Y9 Acute embolism and thrombosis of unspecified deep veins of unspecified proximal lower extremity: Secondary | ICD-10-CM

## 2012-05-06 NOTE — Patient Instructions (Addendum)
Hold Coumadin tomorrow. 5 mg only on mondays (1 whole tab).  2.5 mg (1/2) on other days. Recheck in 4 weeks.   Anticoagulation Dose Instructions as of 05/06/2012     Glynis Smiles Tue Wed Thu Fri Sat   New Dose 2.5 mg 5 mg 2.5 mg 2.5 mg 2.5 mg 2.5 mg 2.5 mg    Description       Hold Coumadin tomorrow. 5 mg only on mondays (1 whole tab).  2.5 mg (1/2) on other days. Recheck in 4 weeks.

## 2012-06-03 ENCOUNTER — Ambulatory Visit (INDEPENDENT_AMBULATORY_CARE_PROVIDER_SITE_OTHER): Payer: 59 | Admitting: Family

## 2012-06-03 DIAGNOSIS — E039 Hypothyroidism, unspecified: Secondary | ICD-10-CM

## 2012-06-03 DIAGNOSIS — I82401 Acute embolism and thrombosis of unspecified deep veins of right lower extremity: Secondary | ICD-10-CM

## 2012-06-03 DIAGNOSIS — I82419 Acute embolism and thrombosis of unspecified femoral vein: Secondary | ICD-10-CM

## 2012-06-03 DIAGNOSIS — I82409 Acute embolism and thrombosis of unspecified deep veins of unspecified lower extremity: Secondary | ICD-10-CM

## 2012-06-03 DIAGNOSIS — I824Y9 Acute embolism and thrombosis of unspecified deep veins of unspecified proximal lower extremity: Secondary | ICD-10-CM

## 2012-06-03 LAB — TSH: TSH: 0.23 u[IU]/mL — ABNORMAL LOW (ref 0.35–5.50)

## 2012-06-03 NOTE — Patient Instructions (Signed)
5 mg only on mondays (1 whole tab).  2.5 mg (1/2) on other days. Recheck in 4 weeks.   Anticoagulation Dose Instructions as of 06/03/2012     Glynis Smiles Tue Wed Thu Fri Sat   New Dose 2.5 mg 5 mg 2.5 mg 2.5 mg 2.5 mg 2.5 mg 2.5 mg    Description       5 mg only on mondays (1 whole tab).  2.5 mg (1/2) on other days. Recheck in 4 weeks.

## 2012-06-04 ENCOUNTER — Other Ambulatory Visit: Payer: Self-pay | Admitting: *Deleted

## 2012-06-04 DIAGNOSIS — E039 Hypothyroidism, unspecified: Secondary | ICD-10-CM

## 2012-06-04 MED ORDER — LEVOTHYROXINE SODIUM 88 MCG PO TABS: 88.0000 ug | ORAL_TABLET | Freq: Every day | ORAL | Status: DC

## 2012-06-04 NOTE — Progress Notes (Signed)
Quick Note:  Pt informed, Rx and future lab ordered ______

## 2012-06-29 ENCOUNTER — Telehealth: Payer: Self-pay | Admitting: Family Medicine

## 2012-06-29 NOTE — Telephone Encounter (Signed)
Please advise if okay to refill Hydrocodone? 

## 2012-06-29 NOTE — Telephone Encounter (Signed)
Pt needs refill on hydrocodone call into [facility]

## 2012-06-29 NOTE — Telephone Encounter (Signed)
OK to refill once.   

## 2012-06-30 ENCOUNTER — Other Ambulatory Visit: Payer: Self-pay | Admitting: Family Medicine

## 2012-06-30 MED ORDER — HYDROCODONE-ACETAMINOPHEN 5-325 MG PO TABS: 1.0000 | ORAL_TABLET | Freq: Four times a day (QID) | ORAL | Status: DC | PRN

## 2012-06-30 NOTE — Telephone Encounter (Signed)
Called and left on voicemail. 

## 2012-07-01 ENCOUNTER — Ambulatory Visit (INDEPENDENT_AMBULATORY_CARE_PROVIDER_SITE_OTHER): Payer: 59 | Admitting: Family

## 2012-07-01 DIAGNOSIS — I82401 Acute embolism and thrombosis of unspecified deep veins of right lower extremity: Secondary | ICD-10-CM

## 2012-07-01 DIAGNOSIS — I82409 Acute embolism and thrombosis of unspecified deep veins of unspecified lower extremity: Secondary | ICD-10-CM

## 2012-07-01 NOTE — Patient Instructions (Signed)
5 mg only on mondays (1 whole tab).  2.5 mg (1/2) on other days. Recheck in 4 weeks.   Anticoagulation Dose Instructions as of 07/01/2012     Glynis Smiles Tue Wed Thu Fri Sat   New Dose 2.5 mg 5 mg 2.5 mg 2.5 mg 2.5 mg 2.5 mg 2.5 mg    Description       5 mg only on mondays (1 whole tab).  2.5 mg (1/2) on other days. Recheck in 4 weeks.

## 2012-07-29 ENCOUNTER — Ambulatory Visit (INDEPENDENT_AMBULATORY_CARE_PROVIDER_SITE_OTHER): Payer: 59 | Admitting: Family

## 2012-07-29 DIAGNOSIS — I82409 Acute embolism and thrombosis of unspecified deep veins of unspecified lower extremity: Secondary | ICD-10-CM

## 2012-07-29 DIAGNOSIS — I82402 Acute embolism and thrombosis of unspecified deep veins of left lower extremity: Secondary | ICD-10-CM

## 2012-07-29 NOTE — Patient Instructions (Addendum)
5 mg only on mondays (1 whole tab).  2.5 mg (1/2) on other days. Recheck in 4 weeks.   Anticoagulation Dose Instructions as of 07/29/2012     Tyler Atkinson Tue Wed Thu Fri Sat   New Dose 2.5 mg 5 mg 2.5 mg 2.5 mg 2.5 mg 2.5 mg 2.5 mg    Description       5 mg only on mondays (1 whole tab).  2.5 mg (1/2) on other days. Recheck in 4 weeks.

## 2012-09-01 ENCOUNTER — Ambulatory Visit (INDEPENDENT_AMBULATORY_CARE_PROVIDER_SITE_OTHER): Payer: 59 | Admitting: Family

## 2012-09-01 DIAGNOSIS — I82409 Acute embolism and thrombosis of unspecified deep veins of unspecified lower extremity: Secondary | ICD-10-CM

## 2012-09-01 LAB — POCT INR: INR: 2.4

## 2012-09-01 MED ORDER — LIOTHYRONINE SODIUM 25 MCG PO TABS: 25.0000 ug | ORAL_TABLET | Freq: Every day | ORAL | Status: DC

## 2012-09-01 NOTE — Patient Instructions (Addendum)
5 mg only on mondays (1 whole tab).  2.5 mg (1/2) on other days. Recheck in 4 weeks.   Anticoagulation Dose Instructions as of 09/01/2012     Glynis Smiles Tue Wed Thu Fri Sat   New Dose 2.5 mg 5 mg 2.5 mg 2.5 mg 2.5 mg 2.5 mg 2.5 mg    Description       5 mg only on mondays (1 whole tab).  2.5 mg (1/2) on other days. Recheck in 4 weeks.

## 2012-09-09 ENCOUNTER — Telehealth: Payer: Self-pay | Admitting: Family Medicine

## 2012-09-09 NOTE — Telephone Encounter (Signed)
Refill once.  Avoid regular use. 

## 2012-09-09 NOTE — Telephone Encounter (Signed)
Last refill 06/30/12 #30 no refill Last visit 01/13/12

## 2012-09-09 NOTE — Telephone Encounter (Signed)
Pt is calling to request a refill of his HYDROcodone-acetaminophen (NORCO/VICODIN) 5-325 MG per tablet be called into the [facility] in [place]. Please assist.

## 2012-09-10 MED ORDER — HYDROCODONE-ACETAMINOPHEN 5-325 MG PO TABS: 1.0000 | ORAL_TABLET | Freq: Four times a day (QID) | ORAL | Status: DC | PRN

## 2012-09-10 NOTE — Telephone Encounter (Signed)
Called in rx

## 2012-09-29 ENCOUNTER — Ambulatory Visit: Payer: 59 | Admitting: Family

## 2012-09-29 DIAGNOSIS — I82402 Acute embolism and thrombosis of unspecified deep veins of left lower extremity: Secondary | ICD-10-CM

## 2012-09-29 LAB — POCT INR: INR: 1.8

## 2012-09-29 MED ORDER — ROSUVASTATIN CALCIUM 5 MG PO TABS: ORAL_TABLET | ORAL | Status: DC

## 2012-09-29 NOTE — Patient Instructions (Addendum)
Take an extra 1/2 tab today only. Then continue 5 mg only on mondays (1 whole tab).  2.5 mg (1/2) on other days. Recheck in 4 weeks  Anticoagulation Dose Instructions as of 09/29/2012     Tyler Atkinson Tue Wed Thu Fri Sat   New Dose 2.5 mg 5 mg 2.5 mg 2.5 mg 2.5 mg 2.5 mg 2.5 mg    Description       Take an extra 1/2 tab today only. Then continue 5 mg only on mondays (1 whole tab).  2.5 mg (1/2) on other days. Recheck in 4 weeks.

## 2012-10-27 ENCOUNTER — Ambulatory Visit (INDEPENDENT_AMBULATORY_CARE_PROVIDER_SITE_OTHER): Payer: 59 | Admitting: Family

## 2012-10-27 DIAGNOSIS — I824Y9 Acute embolism and thrombosis of unspecified deep veins of unspecified proximal lower extremity: Secondary | ICD-10-CM

## 2012-10-27 DIAGNOSIS — I82419 Acute embolism and thrombosis of unspecified femoral vein: Secondary | ICD-10-CM

## 2012-10-27 DIAGNOSIS — I82401 Acute embolism and thrombosis of unspecified deep veins of right lower extremity: Secondary | ICD-10-CM

## 2012-10-27 DIAGNOSIS — I82409 Acute embolism and thrombosis of unspecified deep veins of unspecified lower extremity: Secondary | ICD-10-CM

## 2012-10-27 LAB — POCT INR: INR: 2

## 2012-11-03 ENCOUNTER — Telehealth: Payer: Self-pay | Admitting: Family Medicine

## 2012-11-03 NOTE — Telephone Encounter (Signed)
Refill once 

## 2012-11-03 NOTE — Telephone Encounter (Signed)
Pt is calling request a refill of his HYDROcodone-acetaminophen (NORCO/VICODIN) 5-325 MG per tablet, please assist.

## 2012-11-03 NOTE — Telephone Encounter (Signed)
Last visit 01/13/12 Last refill 09/10/12 #30 0 refill

## 2012-11-04 MED ORDER — HYDROCODONE-ACETAMINOPHEN 5-325 MG PO TABS: 1.0000 | ORAL_TABLET | Freq: Four times a day (QID) | ORAL | Status: DC | PRN

## 2012-11-04 NOTE — Telephone Encounter (Signed)
rx ready for pick up and patient is aware  

## 2012-11-07 ENCOUNTER — Other Ambulatory Visit: Payer: Self-pay | Admitting: Family

## 2012-11-11 ENCOUNTER — Encounter: Payer: Self-pay | Admitting: Family Medicine

## 2012-11-11 ENCOUNTER — Other Ambulatory Visit: Payer: Self-pay

## 2012-11-11 ENCOUNTER — Telehealth: Payer: Self-pay | Admitting: Family Medicine

## 2012-11-11 ENCOUNTER — Ambulatory Visit (INDEPENDENT_AMBULATORY_CARE_PROVIDER_SITE_OTHER): Payer: 59 | Admitting: Family Medicine

## 2012-11-11 VITALS — BP 128/64 | HR 90 | Temp 98.0°F | Wt 175.0 lb

## 2012-11-11 DIAGNOSIS — R21 Rash and other nonspecific skin eruption: Secondary | ICD-10-CM

## 2012-11-11 DIAGNOSIS — Z23 Encounter for immunization: Secondary | ICD-10-CM

## 2012-11-11 MED ORDER — LEVOTHYROXINE SODIUM 88 MCG PO TABS: 88.0000 ug | ORAL_TABLET | Freq: Every day | ORAL | Status: DC

## 2012-11-11 NOTE — Patient Instructions (Addendum)
Keep wound dry for the first 24 hours then clean daily with soap and water for one week. Apply topical antibiotic daily for 3-4 days. Keep covered with clean dressing for 4-5 days. Follow up promptly for any signs of infection such as redness, warmth, pain, or drainage. Psoriasis Psoriasis is a common, long-lasting (chronic) inflammation of the skin. It affects both men and women equally, of all ages and all races. Psoriasis cannot be passed from person to person (not contagious). Psoriasis varies from mild to very severe. When severe, it can greatly affect your quality of life. Psoriasis is an inflammatory disorder affecting the skin as well as other organs including the joints (causing an arthritis). With psoriasis, the skin sheds its top layer of cells more rapidly than it does in someone without psoriasis. CAUSES  The cause of psoriasis is largely unknown. Genetics, your immune system, and the environment seem to play a role in causing psoriasis. Factors that can make psoriasis worse include:  Damage or trauma to the skin, such as cuts, scrapes, and sunburn. This damage often causes new areas of psoriasis (lesions).  Winter dryness and lack of sunlight.  Medicines such as lithium, beta-blockers, antimalarial drugs, ACE inhibitors, nonsteroidal anti-inflammatory drugs (ibuprofen, aspirin), and terbinafine. Let your caregiver know if you are taking any of these drugs.  Alcohol. Excessive alcohol use should be avoided if you have psoriasis. Drinking large amounts of alcohol can affect:  How well your psoriasis treatment works.  How safe your psoriasis treatment is.  Smoking. If you smoke, ask your caregiver for help to quit.  Stress.  Bacterial or viral infections.  Arthritis. Arthritis associated with psoriasis (psoriatic arthritis) affects less than 10% of patients with psoriasis. The arthritic intensity does not always match the skin psoriasis intensity. It is important to let your  caregiver know if your joints hurt or if they are stiff. SYMPTOMS  The most common form of psoriasis begins with little red bumps that gradually become larger. The bumps begin to form scales that flake off easily. The lower layers of scales stick together. When these scales are scratched or removed, the underlying skin is tender and bleeds easily. These areas then grow in size and may become large. Psoriasis often creates a rash that looks the same on both sides of the body (symmetrical). It often affects the elbows, knees, groin, genitals, arms, legs, scalp, and nails. Affected nails often have pitting, loosen, thicken, crumble, and are difficult to treat.  "Inverse psoriasis"occurs in the armpits, under breasts, in skin folds, and around the groin, buttocks, and genitals.  "Guttate psoriasis" generally occurs in children and young adults following a recent sore throat (strep throat). It begins with many small, red, scaly spots on the skin. It clears spontaneously in weeks or a few months without treatment. DIAGNOSIS  Psoriasis is diagnosed by physical exam. A tissue sample (biopsy) may also be taken. TREATMENT The treatment of psoriasis depends on your age, health, and living conditions.  Steroid (cortisone) creams, lotions, and ointments may be used. These treatments are associated with thinning of the skin, blood vessels that get larger (dilated), loss of skin pigmentation, and easy bruising. It is important to use these steroids as directed by your caregiver. Only treat the affected areas and not the normal, unaffected skin. People on long-term steroid treatment should wear a medical alert bracelet. Injections may be used in areas that are difficult to treat.  Scalp treatments are available as shampoos, solutions, sprays, foams, and oils. Avoid  scratching the scalp and picking at the scales.  Anthralin medicine works well on areas that are difficult to treat. However, it stains clothes and skin  and may cause temporary irritation.  Synthetic vitamin D (calcipotriene)can be used on small areas. It is available by prescription. The forms of synthetic vitamin D available in health food stores do not help with psoriasis.  Coal tarsare available in various strengths for psoriasis that is difficult to treat. They are one of the longest used treatments for difficult to treat psoriasis. However, they are messy to use.  Light therapy (UV therapy) can be carefully and professionally monitored in a dermatologist's office. Careful sunbathing is helpful for many people as directed by your caregiver. The exposure should be just long enough to cause a mild redness (erythema) of your skin. Avoid sunburn as this may make the condition worse. Sunscreen (SPF of 30 or higher) should be used to protect against sunburn. Cataracts, wrinkles, and skin aging are some of the harmful side effects of light therapy.  If creams (topical medicines) fail, there are several other options for systemic or oral medicines your caregiver can suggest. Psoriasis can sometimes be very difficult to treat. It can come and go. It is necessary to follow up with your caregiver regularly if your psoriasis is difficult to treat. Usually, with persistence you can get a good amount of relief. Maintaining consistent care is important. Do not change caregivers just because you do not see immediate results. It may take several trials to find the right combination of treatment for you. PREVENTING FLARE-UPS  Wear gloves while you wash dishes, while cleaning, and when you are outside in the cold.  If you have radiators, place a bowl of water or damp towel on the radiator. This will help put water back in the air. You can also use a humidifier to keep the air moist. Try to keep the humidity at about 60% in your home.  Apply moisturizer while your skin is still damp from bathing or showering. This traps water in the skin.  Avoid long, hot baths  or showers. Keep soap use to a minimum. Soaps dry out the skin and wash away the protective oils. Use a fragrance free, dye free soap.  Drink enough water and fluids to keep your urine clear or pale yellow. Not drinking enough water depletes your skin's water supply.  Turn off the heat at night and keep it low during the day. Cool air is less drying. SEEK MEDICAL CARE IF:  You have increasing pain in the affected areas.  You have uncontrolled bleeding in the affected areas.  You have increasing redness or warmth in the affected areas.  You start to have pain or stiffness in your joints.  You start feeling depressed about your condition.  You have a fever. Document Released: 01/11/2000 Document Revised: 04/07/2011 Document Reviewed: 07/08/2010 [facility] Patient Information 2014 [place], [place].

## 2012-11-11 NOTE — Telephone Encounter (Signed)
You can make a appointment by using two 15 mins slots back to back

## 2012-11-11 NOTE — Telephone Encounter (Signed)
Pt sch 11/18/12 at 0930 and is aware.

## 2012-11-11 NOTE — Telephone Encounter (Signed)
Please advise what 30 min time slot I can schedule patient on 11/18/12 for mole removal??  Thanks a bunch.  Archie Patten

## 2012-11-11 NOTE — Progress Notes (Signed)
  Subjective:    Patient ID: Tyler Atkinson, male    DOB: [birth year], 57 y.o.   MRN: [id number]  HPI Patient seen for the following Skin lesion right posterior thigh. This was excised 2 years ago and pathology irritated verruca vulgaris. This was previously removed by shave excision. This has regrown.  Wants to have excised. He does take Coumadin for history of DVT.  He has recurrent rash upper extremities and trunk mostly with some lower extremity involvement. Erythematous base with scaly fairly well demarcated surface. He has noted in the past that this seems to improve with some light exposure. Never biopsied. He's tried various creams without improvement  Past Medical History  Diagnosis Date  . Thyroid cancer   . Depression   . Hay fever     allergies  . Hypothyroidism   . Hyperlipidemia   . DVT of upper extremity (deep vein thrombosis) 3/11   Past Surgical History  Procedure Laterality Date  . Thyroidectomy      2000  . Colonoscopy      01/27/2006. Normal    reports that he quit smoking about 27 years ago. He does not have any smokeless tobacco history on file. His alcohol and drug histories are not on file. family history includes Breast cancer in an other family member; Colon cancer in an other family member; Diabetes in an other family member; Heart disease in an other family member; Hypertension in an other family member; Stroke in an other family member. Allergies  Allergen Reactions  . Ezetimibe-Simvastatin     REACTION: rash X 2-3 months      Review of Systems  Constitutional: Negative for fever and chills.  Skin: Positive for rash.  Hematological: Negative for adenopathy.       Objective:   Physical Exam  Constitutional: He appears well-developed and well-nourished.  Cardiovascular: Normal rate and regular rhythm.   Pulmonary/Chest: Effort normal and breath sounds normal. No respiratory distress. He has no wheezes. He has no rales.  Skin: Rash noted.   Patient's diffuse rash mostly involving upper extremities and trunk. Erythematous base with thick scaling . No pustules. No vesicles  Patient has a fairly large verrucous lesion about 1.2 cm diameter posterior thigh          Assessment & Plan:  #1 recurrent trunk and upper extremity skin rash. Question guttate psorias. Discussed risk and benefits of skin biopsy and patient consented. Anesthesia with 1% Xylocaine with epinephrine. Skin prepped with Betadine. Using sterile technique 5 mm punch biopsy taken. Minimal bleeding. Closed with 2 sutures of 4-0 Ethilon. Antibiotic and dressing applied. Wound care instruction given. Specimen sent to pathology for further evaluation return in one week for suture removal #2 Probable irritated verruca vulgaris posterior thigh. He will hold Coumadin for 2-3 days and then present next week for elliptical excision

## 2012-11-18 ENCOUNTER — Encounter: Payer: Self-pay | Admitting: Family Medicine

## 2012-11-18 ENCOUNTER — Ambulatory Visit (INDEPENDENT_AMBULATORY_CARE_PROVIDER_SITE_OTHER): Payer: 59 | Admitting: Family Medicine

## 2012-11-18 VITALS — BP 140/80 | HR 81 | Temp 97.9°F | Wt 174.0 lb

## 2012-11-18 DIAGNOSIS — L989 Disorder of the skin and subcutaneous tissue, unspecified: Secondary | ICD-10-CM

## 2012-11-18 DIAGNOSIS — L851 Acquired keratosis [keratoderma] palmaris et plantaris: Secondary | ICD-10-CM

## 2012-11-18 DIAGNOSIS — L404 Guttate psoriasis: Secondary | ICD-10-CM

## 2012-11-18 DIAGNOSIS — L408 Other psoriasis: Secondary | ICD-10-CM

## 2012-11-18 DIAGNOSIS — I82409 Acute embolism and thrombosis of unspecified deep veins of unspecified lower extremity: Secondary | ICD-10-CM

## 2012-11-18 LAB — POCT INR: INR: 1.5

## 2012-11-18 NOTE — Progress Notes (Signed)
  Subjective:    Patient ID: Tyler Atkinson, male    DOB: 1955-04-04, 57 y.o.   MRN: [id number]  HPI Patient seen for excision of skin lesion left posterior thigh This was removed by shave excision over one year ago and was verrucous keratosis-confirmed by path.. Patient had requested excision. This is asymptomatic but irritating because of location.  He is on Coumadin for history of DVT and we had him hold Coumadin past couple of days. 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 This was confirmed by recent biopsy. We discussed possible dermatology referral and he is interested at this time.  Past Medical History  Diagnosis Date  . Thyroid cancer   . Depression   . Hay fever     allergies  . Hypothyroidism   . Hyperlipidemia   . DVT of upper extremity (deep vein thrombosis) 3/11   Past Surgical History  Procedure Laterality Date  . Thyroidectomy      2000  . Colonoscopy      01/27/2006. Normal    reports that he quit smoking about 27 years ago. He does not have any smokeless tobacco history on file. His alcohol and drug histories are not on file. family history includes Breast cancer in an other family member; Colon cancer in an other family member; Diabetes in an other family member; Heart disease in an other family member; Hypertension in an other family member; Stroke in an other family member. Allergies  Allergen Reactions  . Ezetimibe-Simvastatin     REACTION: rash X 2-3 months      Review of Systems  Constitutional: Negative for fever and chills.  Skin: Positive for rash.       Objective:   Physical Exam  Constitutional: He appears well-developed and well-nourished.  Cardiovascular: Normal rate and regular rhythm.   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 Dermatology referral #2 skin lesion left posterior thigh. Recurrence following shave excision over one year ago. Suspect large verrucous keratosis. We discussed risk and benefits of excision and patient consented. Skin prepped with Betadine. Anesthesia with 1% Xylocaine with epinephrine. We performed elliptical excision with approximately 3 cm Incision. Minimal bleeding. Closed with 4-0 Ethilon. Antibiotic and dressing applied. Return in approximately 10 days for suture removal.  He will start back coumadin tomorrow.

## 2012-11-18 NOTE — Patient Instructions (Addendum)
Take an extra 1/2 tab today and tomorrow. Then continue same dosage. 1 tab on Monday only. 1/2 tab all other days. Recheck in 2 weeks.   Anticoagulation Dose Instructions as of 11/18/2012     Glynis Smiles Tue Wed Thu Fri Sat   New Dose 2.5 mg 5 mg 2.5 mg 2.5 mg 2.5 mg 2.5 mg 2.5 mg    Description       Take an extra 1/2 tab today and tomorrow. Then continue same dosage. 1 tab on Monday only. 1/2 tab all other days. Recheck in 2 weeks.      Keep wound dry for the first 24 hours then clean daily with soap and water for one week. Apply topical antibiotic daily for 3-4 days. Keep covered with clean dressing for 4-5 days. Follow up promptly for any signs of infection such as redness, warmth, pain, or drainage.

## 2012-11-29 ENCOUNTER — Encounter: Payer: Self-pay | Admitting: Family Medicine

## 2012-11-29 ENCOUNTER — Ambulatory Visit (INDEPENDENT_AMBULATORY_CARE_PROVIDER_SITE_OTHER): Payer: 59 | Admitting: Family Medicine

## 2012-11-29 ENCOUNTER — Other Ambulatory Visit: Payer: Self-pay

## 2012-11-29 VITALS — BP 130/78 | HR 79 | Temp 97.3°F | Wt 175.0 lb

## 2012-11-29 DIAGNOSIS — B078 Other viral warts: Secondary | ICD-10-CM

## 2012-11-29 DIAGNOSIS — B079 Viral wart, unspecified: Secondary | ICD-10-CM

## 2012-11-29 MED ORDER — TADALAFIL 20 MG PO TABS: ORAL_TABLET | ORAL | Status: AC

## 2012-11-29 NOTE — Progress Notes (Signed)
Patient here for suture removal. Patient had recurrence of growth posterior thigh. Previous removal by shave excision with diagnosis of verruca vulgaris We did elliptical excision this time and same pathologic diagnosis. He has healed well no difficulties.  Wound appears good. No signs of secondary infection. Sutures removed without difficulty. No erythema. No drainage.  Assessment: Verruca vulgaris Plan: Suture removal. Reassurance. Followup for any recurrence

## 2012-11-30 ENCOUNTER — Telehealth: Payer: Self-pay | Admitting: Family Medicine

## 2012-11-30 NOTE — Telephone Encounter (Signed)
Pt states he needs prior auth per pharm on cialis. pls advise.

## 2012-11-30 NOTE — Telephone Encounter (Signed)
The prior authorization request has been received and it will be processed.

## 2012-12-01 ENCOUNTER — Ambulatory Visit (INDEPENDENT_AMBULATORY_CARE_PROVIDER_SITE_OTHER): Payer: 59 | Admitting: Family

## 2012-12-01 DIAGNOSIS — I82409 Acute embolism and thrombosis of unspecified deep veins of unspecified lower extremity: Secondary | ICD-10-CM

## 2012-12-01 DIAGNOSIS — Z7901 Long term (current) use of anticoagulants: Secondary | ICD-10-CM

## 2012-12-01 DIAGNOSIS — I82402 Acute embolism and thrombosis of unspecified deep veins of left lower extremity: Secondary | ICD-10-CM

## 2012-12-01 MED ORDER — WARFARIN SODIUM 5 MG PO TABS: 5.0000 mg | ORAL_TABLET | Freq: Every day | ORAL | Status: DC

## 2012-12-01 NOTE — Patient Instructions (Signed)
Hold coumadin today only. Then continue same dosage. 1 tab on Monday only. 1/2 tab all other days. Recheck in 3 weeks.   Anticoagulation Dose Instructions as of 12/01/2012     Glynis Smiles Tue Wed Thu Fri Sat   New Dose 2.5 mg 5 mg 2.5 mg 2.5 mg 2.5 mg 2.5 mg 2.5 mg    Description       Hold coumadin today only. Then continue same dosage. 1 tab on Monday only. 1/2 tab all other days. Recheck in 3 weeks.

## 2012-12-05 ENCOUNTER — Other Ambulatory Visit: Payer: Self-pay | Admitting: Family Medicine

## 2012-12-28 ENCOUNTER — Telehealth: Payer: Self-pay | Admitting: Family Medicine

## 2012-12-28 NOTE — Telephone Encounter (Signed)
Pt request refill HYDROcodone-acetaminophen (NORCO/VICODIN) 5-325 MG per tablet °

## 2012-12-28 NOTE — Telephone Encounter (Signed)
Refill once.  Try to avoid regular use. 

## 2012-12-28 NOTE — Telephone Encounter (Signed)
Last visit 11-18-12 Last refill 11-04-12 #30 0 refill

## 2012-12-29 MED ORDER — HYDROCODONE-ACETAMINOPHEN 5-325 MG PO TABS: 1.0000 | ORAL_TABLET | Freq: Four times a day (QID) | ORAL | Status: DC | PRN

## 2012-12-29 NOTE — Telephone Encounter (Signed)
Pt aware that RX is ready for pick up  

## 2013-01-04 ENCOUNTER — Encounter: Payer: Self-pay | Admitting: *Deleted

## 2013-01-05 ENCOUNTER — Other Ambulatory Visit (INDEPENDENT_AMBULATORY_CARE_PROVIDER_SITE_OTHER): Payer: 59

## 2013-01-05 ENCOUNTER — Ambulatory Visit (INDEPENDENT_AMBULATORY_CARE_PROVIDER_SITE_OTHER): Payer: 59 | Admitting: Family

## 2013-01-05 DIAGNOSIS — Z0000 Encounter for general adult medical examination without abnormal findings: Secondary | ICD-10-CM

## 2013-01-05 DIAGNOSIS — Z7901 Long term (current) use of anticoagulants: Secondary | ICD-10-CM

## 2013-01-05 DIAGNOSIS — I82409 Acute embolism and thrombosis of unspecified deep veins of unspecified lower extremity: Secondary | ICD-10-CM

## 2013-01-05 LAB — LIPID PANEL
LDL Cholesterol: 116 mg/dL — ABNORMAL HIGH (ref 0–99)
Total CHOL/HDL Ratio: 3
VLDL: 23.2 mg/dL (ref 0.0–40.0)

## 2013-01-05 LAB — BASIC METABOLIC PANEL
Calcium: 9.1 mg/dL (ref 8.4–10.5)
GFR: 113.27 mL/min (ref >60.00)
Glucose, Bld: 99 mg/dL (ref 70–99)
Potassium: 4.8 mEq/L (ref 3.5–5.1)
Sodium: 139 mEq/L (ref 135–145)

## 2013-01-05 LAB — POCT URINALYSIS DIPSTICK
Bilirubin, UA: NEGATIVE
Leukocytes, UA: NEGATIVE
Nitrite, UA: NEGATIVE
Protein, UA: NEGATIVE
Urobilinogen, UA: 0.2
pH, UA: 6.5

## 2013-01-05 LAB — CBC WITH DIFFERENTIAL/PLATELET
Basophils Absolute: 0 10*3/uL (ref 0.0–0.1)
Basophils Relative: 0.6 % (ref 0.0–3.0)
Eosinophils Absolute: 0.2 10*3/uL (ref 0.0–0.7)
Eosinophils Relative: 2.1 % (ref 0.0–5.0)
HCT: 41.1 % (ref 39.0–52.0)
Hemoglobin: 14 g/dL (ref 13.0–17.0)
Lymphocytes Relative: 27.9 % (ref 12.0–46.0)
Lymphs Abs: 2.1 10*3/uL (ref 0.7–4.0)
MCV: 95 fl (ref 78.0–100.0)
Monocytes Relative: 9.7 % (ref 3.0–12.0)
Neutro Abs: 4.4 10*3/uL (ref 1.4–7.7)
Neutrophils Relative %: 59.7 % (ref 43.0–77.0)
RBC: 4.32 Mil/uL (ref 4.22–5.81)
RDW: 13 % (ref 11.5–14.6)

## 2013-01-05 LAB — HEPATIC FUNCTION PANEL
ALT: 21 U/L (ref 0–53)
AST: 21 U/L (ref 0–37)
Albumin: 4.5 g/dL (ref 3.5–5.2)
Bilirubin, Direct: 0.1 mg/dL (ref 0.0–0.3)
Total Bilirubin: 0.7 mg/dL (ref 0.3–1.2)

## 2013-01-05 LAB — TSH: TSH: 0.68 u[IU]/mL (ref 0.35–5.50)

## 2013-01-05 NOTE — Patient Instructions (Signed)
Continue same dosage. 1 tab on Monday only. 1/2 tab all other days. Recheck in 4 weeks.  Anticoagulation Dose Instructions as of 01/05/2013     Glynis Smiles Tue Wed Thu Fri Sat   New Dose 2.5 mg 5 mg 2.5 mg 2.5 mg 2.5 mg 2.5 mg 2.5 mg    Description       Continue same dosage. 1 tab on Monday only. 1/2 tab all other days. Recheck in 4 weeks.

## 2013-01-13 ENCOUNTER — Encounter: Payer: Self-pay | Admitting: Family Medicine

## 2013-01-13 ENCOUNTER — Other Ambulatory Visit: Payer: Self-pay

## 2013-01-13 ENCOUNTER — Ambulatory Visit (INDEPENDENT_AMBULATORY_CARE_PROVIDER_SITE_OTHER): Payer: 59 | Admitting: Family Medicine

## 2013-01-13 VITALS — BP 140/74 | HR 86 | Temp 98.3°F | Ht 69.5 in | Wt 175.0 lb

## 2013-01-13 DIAGNOSIS — Z0000 Encounter for general adult medical examination without abnormal findings: Secondary | ICD-10-CM

## 2013-01-13 MED ORDER — SERTRALINE HCL 50 MG PO TABS: ORAL_TABLET | ORAL | Status: DC

## 2013-01-13 MED ORDER — LIOTHYRONINE SODIUM 25 MCG PO TABS: 25.0000 ug | ORAL_TABLET | Freq: Every day | ORAL | Status: DC

## 2013-01-13 NOTE — Progress Notes (Signed)
Subjective:    Patient ID: Tyler Atkinson, male    DOB: [birth year], 57 y.o.   MRN: [id number]  HPI  Patient here for complete physical. He has chronic problems of hyperlipidemia, history of DVT upper extremity, history depression, recent guttate psoriasis.  He has chronic bilateral hip pain has taken hydrocodone and generally takes about 30 tablets every 45 days. He cannot take nonsteroidals secondary to Coumadin use. He has hypothyroidism treated with levothyroxin and Cytomel. Had recently been over replaced. Hyperlipidemia treated with Crestor.  Immunizations are up-to-date. Last colonoscopy was about 6-7 years ago but we have no records.  Patient had recent positive drug screen for tetrahydrocannabinol. He does smoke this infrequently. His hydrocodone screen was negative but he states he had not taken  tablet in about 3 or 4 days. He sometimes goes 2 days without taking medication regularly. Regarding his chronic hip and back pain he cannot take nonsteroidals. He's had poor control with Tylenol. His pain is moderate and affects daily activities such as walking. He has not had any history of escalation with use. No side effects such as constipation.  Past Medical History  Diagnosis Date  . Thyroid cancer   . Depression   . Hay fever     allergies  . Hypothyroidism   . Hyperlipidemia   . DVT of upper extremity (deep vein thrombosis) 3/11   Past Surgical History  Procedure Laterality Date  . Thyroidectomy      2000  . Colonoscopy      01/27/2006. Normal    reports that he quit smoking about 27 years ago. He does not have any smokeless tobacco history on file. His alcohol and drug histories are not on file. family history includes Breast cancer in an other family member; Colon cancer in an other family member; Diabetes in an other family member; Heart disease in an other family member; Hypertension in an other family member; Stroke in an other family member. Allergies  Allergen  Reactions  . Ezetimibe-Simvastatin     REACTION: rash X 2-3 months     Review of Systems  Constitutional: Negative for fever, activity change, appetite change and fatigue.  HENT: Negative for congestion, ear pain and trouble swallowing.   Eyes: Negative for pain and visual disturbance.  Respiratory: Negative for cough, shortness of breath and wheezing.   Cardiovascular: Negative for chest pain and palpitations.  Gastrointestinal: Negative for nausea, vomiting, abdominal pain, diarrhea, constipation, blood in stool, abdominal distention and rectal pain.  Genitourinary: Negative for dysuria, hematuria and testicular pain.  Musculoskeletal: Positive for arthralgias. Negative for joint swelling.  Skin: Negative for rash.  Neurological: Negative for dizziness, syncope and headaches.  Hematological: Negative for adenopathy.  Psychiatric/Behavioral: Negative for confusion and dysphoric mood.       Objective:   Physical Exam  Constitutional: He is oriented to person, place, and time. He appears well-developed and well-nourished. No distress.  HENT:  Head: Normocephalic and atraumatic.  Right Ear: External ear normal.  Left Ear: External ear normal.  Mouth/Throat: Oropharynx is clear and moist.  Eyes: Conjunctivae and EOM are normal. Pupils are equal, round, and reactive to light.  Neck: Normal range of motion. Neck supple. No thyromegaly present.  Cardiovascular: Normal rate, regular rhythm and normal heart sounds.   No murmur heard. Pulmonary/Chest: No respiratory distress. He has no wheezes. He has no rales.  Abdominal: Soft. Bowel sounds are normal. He exhibits no distension and no mass. There is no tenderness. There is no  rebound and no guarding.  Musculoskeletal: He exhibits no edema.  Tender lateral hip bilateral.  Good ROM both hips.  Lymphadenopathy:    He has no cervical adenopathy.  Neurological: He is alert and oriented to person, place, and time. He displays normal  reflexes. No cranial nerve deficit.  Skin: No rash noted.  Psychiatric: He has a normal mood and affect.          Assessment & Plan:  #1 complete physical. Immunizations up-to-date. Flu vaccine already given. Confirm date of last colonoscopy. We discussed the importance of exercise and weight loss #2 history of chronic bilateral hip and low back pain. Use low-dose hydrocodone. Cannot take nonsteroidals secondary to Coumadin.  Hx of poor relief with Tylenol. #3 history of DVT upper extremity. We discussed the fact that since he only had one DVT could consider discontinuation of Coumadin but he has been very reluctant. He did not have any clear injury or provoking factor.

## 2013-01-13 NOTE — Progress Notes (Signed)
Pre visit review using our clinic review tool, if applicable. No additional management support is needed unless otherwise documented below in the visit note. 

## 2013-02-02 ENCOUNTER — Ambulatory Visit (INDEPENDENT_AMBULATORY_CARE_PROVIDER_SITE_OTHER): Payer: 59 | Admitting: Family

## 2013-02-02 DIAGNOSIS — Z7901 Long term (current) use of anticoagulants: Secondary | ICD-10-CM

## 2013-02-02 DIAGNOSIS — I824Y9 Acute embolism and thrombosis of unspecified deep veins of unspecified proximal lower extremity: Secondary | ICD-10-CM

## 2013-02-02 DIAGNOSIS — I82419 Acute embolism and thrombosis of unspecified femoral vein: Secondary | ICD-10-CM

## 2013-02-02 LAB — POCT INR: INR: 2.4

## 2013-02-02 NOTE — Patient Instructions (Signed)
Anticoagulation Dose Instructions as of 02/02/2013     Dorene Grebe Tue Wed Thu Fri Sat   New Dose 2.5 mg 5 mg 2.5 mg 2.5 mg 2.5 mg 2.5 mg 2.5 mg    Description       Continue same dosage. 1 tab on Monday only. 1/2 tab all other days. Recheck in 4 weeks.

## 2013-02-16 ENCOUNTER — Telehealth: Payer: Self-pay | Admitting: Family Medicine

## 2013-02-16 NOTE — Telephone Encounter (Signed)
Last visit 01/13/13 Last refill 12/29/12 #30 0 refill

## 2013-02-16 NOTE — Telephone Encounter (Signed)
Pt request refill HYDROcodone-acetaminophen (NORCO/VICODIN) 5-325 MG per tablet °

## 2013-02-17 MED ORDER — HYDROCODONE-ACETAMINOPHEN 5-325 MG PO TABS: 1.0000 | ORAL_TABLET | Freq: Four times a day (QID) | ORAL | Status: DC | PRN

## 2013-02-17 NOTE — Telephone Encounter (Signed)
Pt aware that RX is ready for pick up  

## 2013-02-17 NOTE — Telephone Encounter (Signed)
Refill once 

## 2013-02-24 ENCOUNTER — Encounter: Payer: Self-pay | Admitting: Family Medicine

## 2013-03-02 ENCOUNTER — Ambulatory Visit (INDEPENDENT_AMBULATORY_CARE_PROVIDER_SITE_OTHER): Payer: 59 | Admitting: Family

## 2013-03-02 DIAGNOSIS — I82419 Acute embolism and thrombosis of unspecified femoral vein: Secondary | ICD-10-CM

## 2013-03-02 DIAGNOSIS — I824Y9 Acute embolism and thrombosis of unspecified deep veins of unspecified proximal lower extremity: Secondary | ICD-10-CM

## 2013-03-02 DIAGNOSIS — I82409 Acute embolism and thrombosis of unspecified deep veins of unspecified lower extremity: Secondary | ICD-10-CM

## 2013-03-02 LAB — POCT INR: INR: 3.8

## 2013-03-02 NOTE — Patient Instructions (Signed)
Hold Coumadin today. Continue same dosage. 1 tab on Monday only. 1/2 tab all other days. Recheck in 3 weeks.   Anticoagulation Dose Instructions as of 03/02/2013     Tyler Atkinson Tue Wed Thu Fri Sat   New Dose 2.5 mg 5 mg 2.5 mg 2.5 mg 2.5 mg 2.5 mg 2.5 mg    Description       Hold Coumadin today. Continue same dosage. 1 tab on Monday only. 1/2 tab all other days. Recheck in 3 weeks.

## 2013-03-30 ENCOUNTER — Ambulatory Visit: Payer: 59 | Admitting: Family

## 2013-03-30 ENCOUNTER — Ambulatory Visit (INDEPENDENT_AMBULATORY_CARE_PROVIDER_SITE_OTHER): Payer: 59 | Admitting: Family

## 2013-03-30 DIAGNOSIS — I82409 Acute embolism and thrombosis of unspecified deep veins of unspecified lower extremity: Secondary | ICD-10-CM

## 2013-03-30 LAB — POCT INR: INR: 4.3

## 2013-03-30 NOTE — Patient Instructions (Signed)
Hold Coumadin 2 days. 1/2 tab all days. Recheck in 2 weeks.   Anticoagulation Dose Instructions as of 03/30/2013     Tyler Atkinson Tue Wed Thu Fri Sat   New Dose 2.5 mg 2.5 mg 2.5 mg 2.5 mg 2.5 mg 2.5 mg 2.5 mg    Description       Hold Coumadin 2 days. 1/2 tab all days. Recheck in 2 weeks.

## 2013-04-13 ENCOUNTER — Ambulatory Visit (INDEPENDENT_AMBULATORY_CARE_PROVIDER_SITE_OTHER): Payer: 59 | Admitting: Family

## 2013-04-13 ENCOUNTER — Other Ambulatory Visit: Payer: Self-pay

## 2013-04-13 DIAGNOSIS — I82409 Acute embolism and thrombosis of unspecified deep veins of unspecified lower extremity: Secondary | ICD-10-CM

## 2013-04-13 LAB — POCT INR: INR: 1.8

## 2013-04-13 MED ORDER — HYDROCODONE-ACETAMINOPHEN 5-325 MG PO TABS: 1.0000 | ORAL_TABLET | Freq: Four times a day (QID) | ORAL | Status: DC | PRN

## 2013-04-13 NOTE — Telephone Encounter (Signed)
RX given to patient.

## 2013-04-13 NOTE — Patient Instructions (Addendum)
Anticoagulation Dose Instructions as of 04/13/2013     Tyler Atkinson Tue Wed Thu Fri Sat   New Dose 2.5 mg 2.5 mg 2.5 mg 2.5 mg 2.5 mg 2.5 mg 2.5 mg    Description        1/2 tab all days. Recheck in [place].

## 2013-04-27 ENCOUNTER — Ambulatory Visit (INDEPENDENT_AMBULATORY_CARE_PROVIDER_SITE_OTHER): Payer: 59 | Admitting: Family

## 2013-04-27 DIAGNOSIS — I82409 Acute embolism and thrombosis of unspecified deep veins of unspecified lower extremity: Secondary | ICD-10-CM

## 2013-04-27 LAB — POCT INR: INR: 2.5

## 2013-04-27 NOTE — Patient Instructions (Signed)
1/2 tab all days. Recheck in 4weeks.   Anticoagulation Dose Instructions as of 04/27/2013     Tyler Atkinson Tue Wed Thu Fri Sat   New Dose 2.5 mg 2.5 mg 2.5 mg 2.5 mg 2.5 mg 2.5 mg 2.5 mg    Description        1/2 tab all days. Recheck in 4weeks.

## 2013-06-01 ENCOUNTER — Telehealth: Payer: Self-pay

## 2013-06-01 ENCOUNTER — Ambulatory Visit (INDEPENDENT_AMBULATORY_CARE_PROVIDER_SITE_OTHER): Payer: 59 | Admitting: Family

## 2013-06-01 DIAGNOSIS — I82409 Acute embolism and thrombosis of unspecified deep veins of unspecified lower extremity: Secondary | ICD-10-CM

## 2013-06-01 LAB — POCT INR: INR: 2.9

## 2013-06-01 MED ORDER — HYDROCODONE-ACETAMINOPHEN 5-325 MG PO TABS
1.0000 | ORAL_TABLET | Freq: Four times a day (QID) | ORAL | Status: DC | PRN
Start: 2013-06-01 — End: 2013-08-08

## 2013-06-01 NOTE — Telephone Encounter (Signed)
Refill OK

## 2013-06-01 NOTE — Telephone Encounter (Signed)
Hydrocodone  Last visit 01/13/13 Last refill 04/13/13 #30 0 refill

## 2013-06-01 NOTE — Patient Instructions (Signed)
1/2 tab all days. Recheck in 4weeks.   Anticoagulation Dose Instructions as of 06/01/2013     Dorene Grebe Tue Wed Thu Fri Sat   New Dose 2.5 mg 2.5 mg 2.5 mg 2.5 mg 2.5 mg 2.5 mg 2.5 mg    Description        1/2 tab all days. Recheck in 4weeks.

## 2013-06-01 NOTE — Telephone Encounter (Signed)
Pt aware that RX is ready for pick up  

## 2013-06-29 ENCOUNTER — Ambulatory Visit (INDEPENDENT_AMBULATORY_CARE_PROVIDER_SITE_OTHER): Payer: 59 | Admitting: Family

## 2013-06-29 DIAGNOSIS — I82409 Acute embolism and thrombosis of unspecified deep veins of unspecified lower extremity: Secondary | ICD-10-CM

## 2013-06-29 LAB — POCT INR: INR: 2.8

## 2013-06-29 NOTE — Patient Instructions (Signed)
Hold Coumadin 3 days prior to procedure. Resume Coumadin the evening after.  Recheck in 4weeks.   Anticoagulation Dose Instructions as of 06/29/2013     Dorene Grebe Tue Wed Thu Fri Sat   New Dose 2.5 mg 2.5 mg 2.5 mg 2.5 mg 2.5 mg 2.5 mg 2.5 mg    Description        Hold Coumadin 3 days prior to procedure. Resume Coumadin the evening after.  Recheck in 4weeks.

## 2013-07-26 ENCOUNTER — Ambulatory Visit (INDEPENDENT_AMBULATORY_CARE_PROVIDER_SITE_OTHER): Payer: 59 | Admitting: Family

## 2013-07-26 DIAGNOSIS — I82409 Acute embolism and thrombosis of unspecified deep veins of unspecified lower extremity: Secondary | ICD-10-CM

## 2013-07-26 LAB — POCT INR: INR: 3.6

## 2013-07-26 NOTE — Patient Instructions (Signed)
Hold Coumadin today only then continue current dosage.  Recheck in 4 weeks.   Anticoagulation Dose Instructions as of 07/26/2013     Tyler Atkinson Tue Wed Thu Fri Sat   New Dose 2.5 mg 2.5 mg 2.5 mg 2.5 mg 2.5 mg 2.5 mg 2.5 mg    Description        Hold Coumadin today only then continue current dosage.  Recheck in 4 weeks.

## 2013-07-27 ENCOUNTER — Ambulatory Visit: Payer: 59 | Admitting: Family

## 2013-08-08 ENCOUNTER — Other Ambulatory Visit: Payer: Self-pay | Admitting: Family Medicine

## 2013-08-08 ENCOUNTER — Telehealth: Payer: Self-pay | Admitting: Family Medicine

## 2013-08-08 MED ORDER — HYDROCODONE-ACETAMINOPHEN 5-325 MG PO TABS: 1.0000 | ORAL_TABLET | Freq: Four times a day (QID) | ORAL | Status: DC | PRN

## 2013-08-08 MED ORDER — WARFARIN SODIUM 5 MG PO TABS: 5.0000 mg | ORAL_TABLET | Freq: Every day | ORAL | Status: DC

## 2013-08-08 NOTE — Telephone Encounter (Signed)
Last visit 01/13/13 Last refill 06/01/13 #30 0 refill

## 2013-08-08 NOTE — Telephone Encounter (Signed)
Refill coumadin for 6 months and hydrocodone once.

## 2013-08-08 NOTE — Telephone Encounter (Signed)
Coumadin refill sent  

## 2013-08-08 NOTE — Telephone Encounter (Signed)
Pt is needing new rx HYDROcodone-acetaminophen (NORCO/VICODIN) 5-325 MG per tablet and warfarin (coumadin) 5mg , please call when available for pick up.

## 2013-08-08 NOTE — Telephone Encounter (Signed)
[facility]

## 2013-08-31 ENCOUNTER — Ambulatory Visit (INDEPENDENT_AMBULATORY_CARE_PROVIDER_SITE_OTHER): Payer: 59 | Admitting: Family

## 2013-08-31 DIAGNOSIS — Z5181 Encounter for therapeutic drug level monitoring: Secondary | ICD-10-CM

## 2013-08-31 LAB — POCT INR: INR: 2.7

## 2013-08-31 NOTE — Patient Instructions (Signed)
Continue 2.5mg  daily. Recheck in 4 weeks.  Anticoagulation Dose Instructions as of 08/31/2013     Dorene Grebe Tue Wed Thu Fri Sat   New Dose 2.5 mg 5 mg 2.5 mg 2.5 mg 2.5 mg 2.5 mg 2.5 mg    Description       Continue 2.5mg  daily. Recheck in 4 weeks.

## 2013-09-20 ENCOUNTER — Telehealth: Payer: Self-pay | Admitting: Family Medicine

## 2013-09-20 NOTE — Telephone Encounter (Signed)
Pt request refill of the following:   HYDROcodone-acetaminophen (NORCO/VICODIN) 5-325 MG per tablet ° ° ° °Phamacy:   Pick up  ° °

## 2013-09-21 MED ORDER — HYDROCODONE-ACETAMINOPHEN 5-325 MG PO TABS: 1.0000 | ORAL_TABLET | Freq: Four times a day (QID) | ORAL | Status: DC | PRN

## 2013-09-21 NOTE — Telephone Encounter (Signed)
Refill OK

## 2013-09-21 NOTE — Telephone Encounter (Signed)
Left message RX is ready for pick up

## 2013-09-21 NOTE — Telephone Encounter (Signed)
Last visit 01/13/13 Last refill 08/08/13 #30 0 refill

## 2013-09-28 ENCOUNTER — Ambulatory Visit (INDEPENDENT_AMBULATORY_CARE_PROVIDER_SITE_OTHER): Payer: 59 | Admitting: Family

## 2013-09-28 DIAGNOSIS — I82409 Acute embolism and thrombosis of unspecified deep veins of unspecified lower extremity: Secondary | ICD-10-CM

## 2013-09-28 LAB — POCT INR: INR: 4.3

## 2013-09-28 NOTE — Patient Instructions (Signed)
Hold Coumadin x 2 days. Continue 2.5mg  daily. Recheck in 4 weeks.   Anticoagulation Dose Instructions as of 09/28/2013     Dorene Grebe Tue Wed Thu Fri Sat   New Dose 2.5 mg 5 mg 2.5 mg 2.5 mg 2.5 mg 2.5 mg 2.5 mg    Description       Hold Coumadin x 2 days. Continue 2.5mg  daily. Recheck in 4 weeks.

## 2013-10-11 ENCOUNTER — Other Ambulatory Visit: Payer: Self-pay | Admitting: Family Medicine

## 2013-10-17 ENCOUNTER — Other Ambulatory Visit: Payer: Self-pay | Admitting: Family Medicine

## 2013-11-01 ENCOUNTER — Telehealth: Payer: Self-pay | Admitting: Family Medicine

## 2013-11-01 NOTE — Telephone Encounter (Signed)
Pt request refill HYDROcodone-acetaminophen (NORCO/VICODIN) 5-325 MG per tablet Pt has a appt w/ padonda  In the am and would like to pu if possible.

## 2013-11-02 ENCOUNTER — Ambulatory Visit (INDEPENDENT_AMBULATORY_CARE_PROVIDER_SITE_OTHER): Payer: 59 | Admitting: Family

## 2013-11-02 DIAGNOSIS — I82409 Acute embolism and thrombosis of unspecified deep veins of unspecified lower extremity: Secondary | ICD-10-CM

## 2013-11-02 LAB — POCT INR: INR: 3

## 2013-11-02 MED ORDER — HYDROCODONE-ACETAMINOPHEN 5-325 MG PO TABS: 1.0000 | ORAL_TABLET | Freq: Four times a day (QID) | ORAL | Status: DC | PRN

## 2013-11-02 NOTE — Telephone Encounter (Signed)
Ok per Dr. Elease Hashimoto to refill.  Rx given to pt at Coumadin appt.

## 2013-11-02 NOTE — Patient Instructions (Signed)
Continue 2.5mg  daily. Recheck in 4 weeks.   Anticoagulation Dose Instructions as of 11/02/2013     Tyler Atkinson Tue Wed Thu Fri Sat   New Dose 2.5 mg 5 mg 2.5 mg 2.5 mg 2.5 mg 2.5 mg 2.5 mg    Description        Continue 2.5mg  daily. Recheck in 4 weeks.

## 2013-11-02 NOTE — Telephone Encounter (Signed)
Last visit 01/13/13 Last refill 09/21/13 #30 0 refill

## 2013-11-07 ENCOUNTER — Other Ambulatory Visit: Payer: Self-pay | Admitting: Family Medicine

## 2013-11-23 ENCOUNTER — Other Ambulatory Visit: Payer: Self-pay | Admitting: Family Medicine

## 2013-11-30 ENCOUNTER — Ambulatory Visit (INDEPENDENT_AMBULATORY_CARE_PROVIDER_SITE_OTHER): Payer: 59 | Admitting: Family

## 2013-11-30 DIAGNOSIS — I82409 Acute embolism and thrombosis of unspecified deep veins of unspecified lower extremity: Secondary | ICD-10-CM

## 2013-11-30 LAB — POCT INR: INR: 2.8

## 2013-11-30 NOTE — Patient Instructions (Signed)
Continue 2.5mg  daily. Recheck in 4 weeks.   Anticoagulation Dose Instructions as of 11/30/2013      Dorene Grebe Tue Wed Thu Fri Sat   New Dose 2.5 mg 5 mg 2.5 mg 2.5 mg 2.5 mg 2.5 mg 2.5 mg    Description         Continue 2.5mg  daily. Recheck in 4 weeks.

## 2013-12-20 ENCOUNTER — Other Ambulatory Visit: Payer: Self-pay | Admitting: Family Medicine

## 2013-12-28 ENCOUNTER — Ambulatory Visit (INDEPENDENT_AMBULATORY_CARE_PROVIDER_SITE_OTHER): Payer: 59 | Admitting: Family

## 2013-12-28 ENCOUNTER — Other Ambulatory Visit (INDEPENDENT_AMBULATORY_CARE_PROVIDER_SITE_OTHER): Payer: 59

## 2013-12-28 DIAGNOSIS — I82409 Acute embolism and thrombosis of unspecified deep veins of unspecified lower extremity: Secondary | ICD-10-CM

## 2013-12-28 DIAGNOSIS — E785 Hyperlipidemia, unspecified: Secondary | ICD-10-CM

## 2013-12-28 DIAGNOSIS — Z0000 Encounter for general adult medical examination without abnormal findings: Secondary | ICD-10-CM

## 2013-12-28 LAB — CBC WITH DIFFERENTIAL/PLATELET
Basophils Absolute: 0 10*3/uL (ref 0.0–0.1)
Basophils Relative: 0.3 % (ref 0.0–3.0)
EOS PCT: 2.5 % (ref 0.0–5.0)
Eosinophils Absolute: 0.2 10*3/uL (ref 0.0–0.7)
HEMATOCRIT: 39.3 % (ref 39.0–52.0)
HEMOGLOBIN: 13.3 g/dL (ref 13.0–17.0)
LYMPHS PCT: 26.1 % (ref 12.0–46.0)
Lymphs Abs: 1.9 10*3/uL (ref 0.7–4.0)
MCHC: 33.8 g/dL (ref 30.0–36.0)
MCV: 95.6 fl (ref 78.0–100.0)
MONOS PCT: 12.3 % — AB (ref 3.0–12.0)
Monocytes Absolute: 0.9 10*3/uL (ref 0.1–1.0)
Neutro Abs: 4.3 10*3/uL (ref 1.4–7.7)
Neutrophils Relative %: 58.8 % (ref 43.0–77.0)
PLATELETS: 287 10*3/uL (ref 150.0–400.0)
RBC: 4.11 Mil/uL — ABNORMAL LOW (ref 4.22–5.81)
RDW: 13.3 % (ref 11.5–15.5)
WBC: 7.3 10*3/uL (ref 4.0–10.5)

## 2013-12-28 LAB — COMPREHENSIVE METABOLIC PANEL
ALT: 15 U/L (ref 0–53)
AST: 22 U/L (ref 0–37)
Albumin: 4.6 g/dL (ref 3.5–5.2)
Alkaline Phosphatase: 78 U/L (ref 39–117)
BUN: 16 mg/dL (ref 6–23)
CHLORIDE: 107 meq/L (ref 96–112)
CO2: 22 meq/L (ref 19–32)
Calcium: 9 mg/dL (ref 8.4–10.5)
Creatinine, Ser: 0.8 mg/dL (ref 0.4–1.5)
GFR: 107.9 mL/min (ref >60.00)
GLUCOSE: 94 mg/dL (ref 70–99)
Potassium: 4.5 mEq/L (ref 3.5–5.1)
SODIUM: 140 meq/L (ref 135–145)
TOTAL PROTEIN: 7.1 g/dL (ref 6.0–8.3)
Total Bilirubin: 0.4 mg/dL (ref 0.2–1.2)

## 2013-12-28 LAB — POCT URINALYSIS DIPSTICK
BILIRUBIN UA: NEGATIVE
Blood, UA: NEGATIVE
GLUCOSE UA: NEGATIVE
Ketones, UA: NEGATIVE
LEUKOCYTES UA: NEGATIVE
Nitrite, UA: NEGATIVE
Protein, UA: NEGATIVE
Spec Grav, UA: 1.01
Urobilinogen, UA: 0.2
pH, UA: 7

## 2013-12-28 LAB — PSA: PSA: 1.1 ng/mL (ref 0.10–4.00)

## 2013-12-28 LAB — LIPID PANEL
CHOLESTEROL: 201 mg/dL — AB (ref 0–200)
HDL: 39.6 mg/dL (ref >39.00)
NonHDL: 161.4
Total CHOL/HDL Ratio: 5
Triglycerides: 385 mg/dL — ABNORMAL HIGH (ref 0.0–149.0)
VLDL: 77 mg/dL — ABNORMAL HIGH (ref 0.0–40.0)

## 2013-12-28 LAB — TSH: TSH: 2.25 u[IU]/mL (ref 0.35–4.50)

## 2013-12-28 LAB — POCT INR: INR: 2.2

## 2013-12-28 NOTE — Patient Instructions (Signed)
Continue 2.5mg  daily. Recheck in 5 weeks.   Anticoagulation Dose Instructions as of 12/28/2013      Dorene Grebe Tue Wed Thu Fri Sat   New Dose 2.5 mg 5 mg 2.5 mg 2.5 mg 2.5 mg 2.5 mg 2.5 mg    Description         Continue 2.5mg  daily. Recheck in 5 weeks.

## 2013-12-29 LAB — LDL CHOLESTEROL, DIRECT: LDL DIRECT: 95.8 mg/dL

## 2014-01-04 ENCOUNTER — Other Ambulatory Visit: Payer: 59

## 2014-01-04 ENCOUNTER — Telehealth: Payer: Self-pay | Admitting: Family Medicine

## 2014-01-04 NOTE — Telephone Encounter (Signed)
Last visit 01/13/13 Last refill 11/02/13 #30 0 refill

## 2014-01-04 NOTE — Telephone Encounter (Signed)
Pt request refill of the following: HYDROcodone-acetaminophen (NORCO/VICODIN) 5-325 MG per tablet ° ° °Phamacy: pick up  ° °

## 2014-01-04 NOTE — Telephone Encounter (Signed)
Refill once.  Needs office follow up. 

## 2014-01-05 MED ORDER — HYDROCODONE-ACETAMINOPHEN 5-325 MG PO TABS: 1.0000 | ORAL_TABLET | Freq: Four times a day (QID) | ORAL | Status: DC | PRN

## 2014-01-05 NOTE — Telephone Encounter (Signed)
Left message on [facility] Rx is ready for pickup. Pt needs a physical exam.

## 2014-01-14 ENCOUNTER — Other Ambulatory Visit: Payer: Self-pay | Admitting: Family

## 2014-01-17 ENCOUNTER — Encounter: Payer: Self-pay | Admitting: Family Medicine

## 2014-01-17 ENCOUNTER — Ambulatory Visit (INDEPENDENT_AMBULATORY_CARE_PROVIDER_SITE_OTHER): Payer: 59 | Admitting: Family Medicine

## 2014-01-17 ENCOUNTER — Ambulatory Visit (INDEPENDENT_AMBULATORY_CARE_PROVIDER_SITE_OTHER): Payer: 59

## 2014-01-17 VITALS — BP 130/80 | HR 74 | Temp 98.6°F | Ht 69.5 in | Wt 174.0 lb

## 2014-01-17 DIAGNOSIS — Z0000 Encounter for general adult medical examination without abnormal findings: Secondary | ICD-10-CM

## 2014-01-17 DIAGNOSIS — Z23 Encounter for immunization: Secondary | ICD-10-CM

## 2014-01-17 NOTE — Progress Notes (Signed)
Pre visit review using our clinic review tool, if applicable. No additional management support is needed unless otherwise documented below in the visit note. 

## 2014-01-17 NOTE — Progress Notes (Signed)
Subjective:    Patient ID: Tyler Atkinson, male    DOB: 07-06-1955, 58 y.o.   MRN: [id number]  HPI Patient seen for complete physical. He is requesting shingles vaccine and flu vaccine. Last tetanus apparently 10 years ago. He thinks he had colonoscopy in 2009 it was told this was normal. He has prior history of DVT upper extremity and is taken Coumadin for several years. This was unprovoked DVT and he has been reluctant to stop Coumadin since then. His other medical problems include history of psoriasis, hypothyroidism, hyperlipidemia, osteoarthritis. Overall doing well. Quit smoking 1987. He takes low-dose hydrocodone infrequently for severe hip pain. Avoids nonsteroidals with Coumadin.  Past Medical History  Diagnosis Date  . Thyroid cancer   . Depression   . Hay fever     allergies  . Hypothyroidism   . Hyperlipidemia   . DVT of upper extremity (deep vein thrombosis) 3/11   Past Surgical History  Procedure Laterality Date  . Thyroidectomy      2000  . Colonoscopy      01/27/2006. Normal    reports that he quit smoking about 28 years ago. He does not have any smokeless tobacco history on file. His alcohol and drug histories are not on file. family history includes Breast cancer in an other family member; Colon cancer in an other family member; Diabetes in an other family member; Heart disease in an other family member; Hypertension in an other family member; Stroke in an other family member. Allergies  Allergen Reactions  . Ezetimibe-Simvastatin     REACTION: rash X 2-3 months      Review of Systems  Constitutional: Negative for fever, activity change, appetite change and fatigue.  HENT: Negative for congestion, ear pain and trouble swallowing.   Eyes: Negative for pain and visual disturbance.  Respiratory: Negative for cough, shortness of breath and wheezing.   Cardiovascular: Negative for chest pain and palpitations.  Gastrointestinal: Negative for nausea, vomiting,  abdominal pain, diarrhea, constipation, blood in stool, abdominal distention and rectal pain.  Genitourinary: Negative for dysuria, hematuria and testicular pain.  Musculoskeletal: Positive for arthralgias. Negative for joint swelling.  Skin: Negative for rash.  Neurological: Negative for dizziness, syncope and headaches.  Hematological: Negative for adenopathy. Does not bruise/bleed easily.  Psychiatric/Behavioral: Negative for confusion and dysphoric mood.       Objective:   Physical Exam  Constitutional: He is oriented to person, place, and time. He appears well-developed and well-nourished. No distress.  HENT:  Head: Normocephalic and atraumatic.  Right Ear: External ear normal.  Left Ear: External ear normal.  Mouth/Throat: Oropharynx is clear and moist.  Eyes: Conjunctivae and EOM are normal. Pupils are equal, round, and reactive to light.  Neck: Normal range of motion. Neck supple. No thyromegaly present.  Cardiovascular: Normal rate, regular rhythm and normal heart sounds.   No murmur heard. Pulmonary/Chest: No respiratory distress. He has no wheezes. He has no rales.  Abdominal: Soft. Bowel sounds are normal. He exhibits no distension and no mass. There is no tenderness. There is no rebound and no guarding.  Musculoskeletal: He exhibits no edema.  Lymphadenopathy:    He has no cervical adenopathy.  Neurological: He is alert and oriented to person, place, and time. He displays normal reflexes. No cranial nerve deficit.  Skin: No rash noted.  Psychiatric: He has a normal mood and affect.          Assessment & Plan:  Complete physical. Shingles vaccine and flu  vaccine given. He also needs tetanus booster and wishes to return later for that. Confirm date of last colonoscopy. Labs reviewed. Elevated triglycerides. Handout given. Reduce sugars and starches. We have several times in the past discussed possible discontinuation of Coumadin. He had basically 1 unprovoked DVT upper  extremity. He has been very reluctant to stop Coumadin because this was unprovoked and he is aware there is a fairly high incidence of recurrence. Continue regular monitoring

## 2014-01-17 NOTE — Patient Instructions (Signed)

## 2014-02-01 ENCOUNTER — Ambulatory Visit (INDEPENDENT_AMBULATORY_CARE_PROVIDER_SITE_OTHER): Payer: 59 | Admitting: Family

## 2014-02-01 DIAGNOSIS — I82409 Acute embolism and thrombosis of unspecified deep veins of unspecified lower extremity: Secondary | ICD-10-CM

## 2014-02-01 LAB — POCT INR: INR: 1.9

## 2014-02-01 NOTE — Patient Instructions (Signed)
Take an extra 1/2 tab today only. Then continue 5 mg on Mondays and 2.5mg  all other days.   Anticoagulation Dose Instructions as of 02/01/2014      Dorene Grebe Tue Wed Thu Fri Sat   New Dose 2.5 mg 5 mg 2.5 mg 2.5 mg 2.5 mg 2.5 mg 2.5 mg    Description        Take an extra 1/2 tab today only. Then continue 5 mg on Mondays and 2.5mg  all other days.

## 2014-02-08 ENCOUNTER — Telehealth: Payer: Self-pay | Admitting: Family Medicine

## 2014-02-08 NOTE — Telephone Encounter (Signed)
Refill OK

## 2014-02-08 NOTE — Telephone Encounter (Signed)
Last visit 01/17/14 Last refill 01/05/14 #30 0 refill

## 2014-02-08 NOTE — Telephone Encounter (Signed)
Pt request refill of the following: HYDROcodone-acetaminophen (NORCO/VICODIN) 5-325 MG per tablet ° ° °Phamacy: °

## 2014-02-09 MED ORDER — HYDROCODONE-ACETAMINOPHEN 5-325 MG PO TABS: 1.0000 | ORAL_TABLET | Freq: Four times a day (QID) | ORAL | Status: DC | PRN

## 2014-02-09 NOTE — Telephone Encounter (Signed)
Pt is aware that Rx is ready for pickup  

## 2014-02-21 ENCOUNTER — Other Ambulatory Visit: Payer: Self-pay | Admitting: Family Medicine

## 2014-03-01 ENCOUNTER — Ambulatory Visit (INDEPENDENT_AMBULATORY_CARE_PROVIDER_SITE_OTHER): Payer: 59 | Admitting: Family

## 2014-03-01 DIAGNOSIS — I82409 Acute embolism and thrombosis of unspecified deep veins of unspecified lower extremity: Secondary | ICD-10-CM

## 2014-03-01 LAB — POCT INR: INR: 2.8

## 2014-03-01 NOTE — Patient Instructions (Signed)
Continue 5 mg on Mondays and 2.5mg  all other days.   Anticoagulation Dose Instructions as of 03/01/2014      Dorene Grebe Tue Wed Thu Fri Sat   New Dose 2.5 mg 5 mg 2.5 mg 2.5 mg 2.5 mg 2.5 mg 2.5 mg    Description        Continue 5 mg on Mondays and 2.5mg  all other days.

## 2014-03-20 ENCOUNTER — Other Ambulatory Visit: Payer: Self-pay | Admitting: Family Medicine

## 2014-03-27 ENCOUNTER — Telehealth: Payer: Self-pay | Admitting: *Deleted

## 2014-03-27 NOTE — Telephone Encounter (Signed)
Refill once OK. 

## 2014-03-27 NOTE — Telephone Encounter (Signed)
Last visit 01/17/14 Last refill 02/09/14 #30 0 refill

## 2014-03-27 NOTE — Telephone Encounter (Signed)
Pt requested a refill on hydrocodone. Please advise

## 2014-03-28 MED ORDER — HYDROCODONE-ACETAMINOPHEN 5-325 MG PO TABS: 1.0000 | ORAL_TABLET | Freq: Four times a day (QID) | ORAL | Status: DC | PRN

## 2014-03-28 NOTE — Addendum Note (Signed)
Addended by: Marcina Millard on: 03/28/2014 08:37 AM   Modules accepted: Orders

## 2014-03-28 NOTE — Telephone Encounter (Signed)
Left message on [facility] that Rx is ready for pickup

## 2014-03-29 ENCOUNTER — Ambulatory Visit (INDEPENDENT_AMBULATORY_CARE_PROVIDER_SITE_OTHER): Payer: 59 | Admitting: Family

## 2014-03-29 ENCOUNTER — Ambulatory Visit: Payer: 59

## 2014-03-29 DIAGNOSIS — I82409 Acute embolism and thrombosis of unspecified deep veins of unspecified lower extremity: Secondary | ICD-10-CM

## 2014-03-29 LAB — POCT INR: INR: 2.8

## 2014-03-29 NOTE — Patient Instructions (Signed)
Continue 5 mg on Mondays and 2.5mg  all other days.   Anticoagulation Dose Instructions as of 03/29/2014      Dorene Grebe Tue Wed Thu Fri Sat   New Dose 2.5 mg 5 mg 2.5 mg 2.5 mg 2.5 mg 2.5 mg 2.5 mg    Description        Continue 5 mg on Mondays and 2.5mg  all other days.

## 2014-05-03 ENCOUNTER — Other Ambulatory Visit (INDEPENDENT_AMBULATORY_CARE_PROVIDER_SITE_OTHER): Payer: 59

## 2014-05-03 ENCOUNTER — Ambulatory Visit: Payer: 59 | Admitting: Family Medicine

## 2014-05-03 DIAGNOSIS — I82409 Acute embolism and thrombosis of unspecified deep veins of unspecified lower extremity: Secondary | ICD-10-CM

## 2014-05-03 DIAGNOSIS — Z0289 Encounter for other administrative examinations: Secondary | ICD-10-CM

## 2014-05-03 LAB — POCT INR: INR: 3

## 2014-05-03 NOTE — Patient Instructions (Signed)
Pt will hole tomorrow, per his request then continue 5 mg on Mondays and 2.5mg  all other days.    Anticoagulation Dose Instructions as of 05/03/2014      Dorene Grebe Tue Wed Thu Fri Sat   New Dose 2.5 mg 5 mg 2.5 mg 2.5 mg 2.5 mg 2.5 mg 2.5 mg    Description        Pt will hole tomorrow, per his request then continue 5 mg on Mondays and 2.5mg  all other days.

## 2014-05-10 ENCOUNTER — Telehealth: Payer: Self-pay | Admitting: Family Medicine

## 2014-05-10 NOTE — Telephone Encounter (Signed)
Pt request refill HYDROcodone-acetaminophen (NORCO/VICODIN) 5-325 MG per tablet °

## 2014-05-10 NOTE — Telephone Encounter (Signed)
Last visit 01/17/14 Last refill 03/28/14 #30 0 refill

## 2014-05-10 NOTE — Telephone Encounter (Signed)
Refill OK

## 2014-05-11 MED ORDER — HYDROCODONE-ACETAMINOPHEN 5-325 MG PO TABS: 1.0000 | ORAL_TABLET | Freq: Four times a day (QID) | ORAL | Status: DC | PRN

## 2014-05-11 NOTE — Telephone Encounter (Signed)
Left message on [facility] that Rx is ready for pickup

## 2014-05-31 ENCOUNTER — Ambulatory Visit (INDEPENDENT_AMBULATORY_CARE_PROVIDER_SITE_OTHER): Payer: 59 | Admitting: General Practice

## 2014-05-31 DIAGNOSIS — Z5181 Encounter for therapeutic drug level monitoring: Secondary | ICD-10-CM

## 2014-05-31 LAB — POCT INR: INR: 1.6

## 2014-05-31 NOTE — Progress Notes (Signed)
Pre visit review using our clinic review tool, if applicable. No additional management support is needed unless otherwise documented below in the visit note. 

## 2014-05-31 NOTE — Patient Instructions (Signed)
Anticoagulation Dose Instructions as of 05/31/2014      Dorene Grebe Tue Wed Thu Fri Sat   New Dose 2.5 mg 5 mg 2.5 mg 2.5 mg 2.5 mg 2.5 mg 2.5 mg    Description        Pt will take extra tablet today, per his request then continue 5 mg on Mondays and 2.5mg  all other days.

## 2014-05-31 NOTE — Progress Notes (Signed)
Agree with plan 

## 2014-06-21 ENCOUNTER — Telehealth: Payer: Self-pay | Admitting: Family Medicine

## 2014-06-21 MED ORDER — HYDROCODONE-ACETAMINOPHEN 5-325 MG PO TABS: 1.0000 | ORAL_TABLET | Freq: Four times a day (QID) | ORAL | Status: DC | PRN

## 2014-06-21 NOTE — Telephone Encounter (Signed)
Refill OK

## 2014-06-21 NOTE — Telephone Encounter (Signed)
Left message on [facility] that Rx is ready for pick up 

## 2014-06-21 NOTE — Telephone Encounter (Signed)
Last visit 01/17/14 Last refill 05/11/14 #30 0 refill

## 2014-06-21 NOTE — Telephone Encounter (Signed)
Pt request refill of the following: HYDROcodone-acetaminophen (NORCO/VICODIN) 5-325 MG per tablet ° ° °Phamacy: °

## 2014-06-28 ENCOUNTER — Ambulatory Visit (INDEPENDENT_AMBULATORY_CARE_PROVIDER_SITE_OTHER): Payer: 59 | Admitting: Family

## 2014-06-28 DIAGNOSIS — I82409 Acute embolism and thrombosis of unspecified deep veins of unspecified lower extremity: Secondary | ICD-10-CM | POA: Diagnosis not present

## 2014-06-28 LAB — POCT INR: INR: 2.2

## 2014-06-28 NOTE — Patient Instructions (Addendum)
Per Abby Potash, Continue 5 mg on Mondays and 2.5mg  all other days. Pt generally schedules 1st Wednesday of every month at 9:30am...next appointment scheduled for 08/03/14 at 9:45 with [facility]. Pt is aware  Anticoagulation Dose Instructions as of 06/28/2014      Dorene Grebe Tue Wed Thu Fri Sat   New Dose 2.5 mg 5 mg 2.5 mg 2.5 mg 2.5 mg 2.5 mg 2.5 mg    Description        Continue 5 mg on Mondays and 2.5mg  all other days. Pt generally schedules 1st Wednesday of every month at 9:30am...next appointment scheduled for 08/03/14 at 9:45 with [facility]. Pt is aware

## 2014-07-12 ENCOUNTER — Other Ambulatory Visit: Payer: Self-pay | Admitting: Family Medicine

## 2014-07-14 ENCOUNTER — Other Ambulatory Visit: Payer: Self-pay | Admitting: Family

## 2014-08-03 ENCOUNTER — Ambulatory Visit (INDEPENDENT_AMBULATORY_CARE_PROVIDER_SITE_OTHER): Payer: 59 | Admitting: General Practice

## 2014-08-03 DIAGNOSIS — I82409 Acute embolism and thrombosis of unspecified deep veins of unspecified lower extremity: Secondary | ICD-10-CM

## 2014-08-03 LAB — POCT INR: INR: 2.9

## 2014-08-03 NOTE — Progress Notes (Signed)
Pre visit review using our clinic review tool, if applicable. No additional management support is needed unless otherwise documented below in the visit note. 

## 2014-08-10 ENCOUNTER — Telehealth: Payer: Self-pay | Admitting: Family Medicine

## 2014-08-10 MED ORDER — HYDROCODONE-ACETAMINOPHEN 5-325 MG PO TABS: 1.0000 | ORAL_TABLET | Freq: Four times a day (QID) | ORAL | Status: DC | PRN

## 2014-08-10 NOTE — Telephone Encounter (Signed)
Refill okay?  

## 2014-08-10 NOTE — Telephone Encounter (Signed)
I left a detailed message the Rx was left at the front desk for the pt to pick up on Friday.

## 2014-08-10 NOTE — Telephone Encounter (Signed)
Pt request refill of the following: HYDROcodone-acetaminophen (NORCO/VICODIN) 5-325 MG per tablet ° ° °Phamacy: °

## 2014-08-31 ENCOUNTER — Ambulatory Visit (INDEPENDENT_AMBULATORY_CARE_PROVIDER_SITE_OTHER): Payer: 59 | Admitting: General Practice

## 2014-08-31 DIAGNOSIS — I82409 Acute embolism and thrombosis of unspecified deep veins of unspecified lower extremity: Secondary | ICD-10-CM

## 2014-08-31 LAB — POCT INR: INR: 2.7

## 2014-08-31 NOTE — Progress Notes (Signed)
Pre visit review using our clinic review tool, if applicable. No additional management support is needed unless otherwise documented below in the visit note. 

## 2014-08-31 NOTE — Progress Notes (Signed)
Agree with recommendations.  

## 2014-09-27 ENCOUNTER — Telehealth: Payer: Self-pay | Admitting: Family Medicine

## 2014-09-27 NOTE — Telephone Encounter (Signed)
Refill OK

## 2014-09-27 NOTE — Telephone Encounter (Signed)
Last visit 01/17/14 Last refill 08/10/14 #30 0 refill

## 2014-09-27 NOTE — Telephone Encounter (Signed)
Pt request refill of the following: HYDROcodone-acetaminophen (NORCO/VICODIN) 5-325 MG per tablet ° ° °Phamacy: °

## 2014-09-28 ENCOUNTER — Ambulatory Visit (INDEPENDENT_AMBULATORY_CARE_PROVIDER_SITE_OTHER): Payer: 59 | Admitting: General Practice

## 2014-09-28 DIAGNOSIS — I82409 Acute embolism and thrombosis of unspecified deep veins of unspecified lower extremity: Secondary | ICD-10-CM

## 2014-09-28 LAB — POCT INR: INR: 2.6

## 2014-09-28 MED ORDER — HYDROCODONE-ACETAMINOPHEN 5-325 MG PO TABS: 1.0000 | ORAL_TABLET | Freq: Four times a day (QID) | ORAL | Status: DC | PRN

## 2014-09-28 NOTE — Telephone Encounter (Signed)
Left message on [facility] that Rx is ready for pickup

## 2014-09-28 NOTE — Progress Notes (Signed)
Pre visit review using our clinic review tool, if applicable. No additional management support is needed unless otherwise documented below in the visit note. 

## 2014-09-28 NOTE — Progress Notes (Signed)
Agree with Coumadin management 

## 2014-10-16 ENCOUNTER — Other Ambulatory Visit: Payer: Self-pay | Admitting: Family

## 2014-10-26 ENCOUNTER — Other Ambulatory Visit: Payer: Self-pay | Admitting: Family Medicine

## 2014-10-26 NOTE — Telephone Encounter (Signed)
Pt needs #30 of levothyroxine (SYNTHROID, LEVOTHROID) 88 MCG tablet, his mail order drug has made a mistake and pt is out of medication. [facility]

## 2014-10-27 MED ORDER — LEVOTHYROXINE SODIUM 88 MCG PO TABS: ORAL_TABLET | ORAL | Status: DC

## 2014-10-27 NOTE — Telephone Encounter (Signed)
Attempted to call pt to make aware [facility]. No answer on pt's home number.

## 2014-10-27 NOTE — Telephone Encounter (Signed)
Rx sent to [place].

## 2014-11-08 ENCOUNTER — Other Ambulatory Visit: Payer: Self-pay | Admitting: Family Medicine

## 2014-11-08 NOTE — Telephone Encounter (Signed)
Pt request refill of the following: HYDROcodone-acetaminophen (NORCO/VICODIN) 5-325 MG per tablet ° ° °Phamacy: °

## 2014-11-08 NOTE — Telephone Encounter (Signed)
Hydrocodone refill request.  Last seen 01/17/2014.  Last filled 09/28/2014.  Please advise and route back to [facility].

## 2014-11-09 ENCOUNTER — Ambulatory Visit (INDEPENDENT_AMBULATORY_CARE_PROVIDER_SITE_OTHER): Payer: 59 | Admitting: General Practice

## 2014-11-09 DIAGNOSIS — Z23 Encounter for immunization: Secondary | ICD-10-CM

## 2014-11-09 DIAGNOSIS — I82409 Acute embolism and thrombosis of unspecified deep veins of unspecified lower extremity: Secondary | ICD-10-CM | POA: Diagnosis not present

## 2014-11-09 LAB — POCT INR: INR: 2.4

## 2014-11-09 MED ORDER — HYDROCODONE-ACETAMINOPHEN 5-325 MG PO TABS: 1.0000 | ORAL_TABLET | Freq: Four times a day (QID) | ORAL | Status: DC | PRN

## 2014-11-09 NOTE — Progress Notes (Signed)
Pre visit review using our clinic review tool, if applicable. No additional management support is needed unless otherwise documented below in the visit note. 

## 2014-11-09 NOTE — Telephone Encounter (Signed)
done

## 2014-12-25 ENCOUNTER — Telehealth: Payer: Self-pay | Admitting: Family Medicine

## 2014-12-25 NOTE — Telephone Encounter (Signed)
Refill once.  Needs office follow up by December.

## 2014-12-25 NOTE — Telephone Encounter (Signed)
Please advise on medication refill.  Last seen 01/17/2014 Pending appt 01/19/2015 Last fill was 11/09/2014 #30

## 2014-12-25 NOTE — Telephone Encounter (Signed)
Patient would like his Hydrocodone prescription refilled. °

## 2014-12-26 MED ORDER — HYDROCODONE-ACETAMINOPHEN 5-325 MG PO TABS: 1.0000 | ORAL_TABLET | Freq: Four times a day (QID) | ORAL | Status: DC | PRN

## 2014-12-26 NOTE — Telephone Encounter (Signed)
Left detailed message that RX was up front to be picked up. Pt aware as well to schedule appt.

## 2014-12-28 ENCOUNTER — Other Ambulatory Visit (INDEPENDENT_AMBULATORY_CARE_PROVIDER_SITE_OTHER): Payer: 59

## 2014-12-28 ENCOUNTER — Ambulatory Visit (INDEPENDENT_AMBULATORY_CARE_PROVIDER_SITE_OTHER): Payer: 59 | Admitting: General Practice

## 2014-12-28 DIAGNOSIS — Z0000 Encounter for general adult medical examination without abnormal findings: Secondary | ICD-10-CM

## 2014-12-28 DIAGNOSIS — I82409 Acute embolism and thrombosis of unspecified deep veins of unspecified lower extremity: Secondary | ICD-10-CM

## 2014-12-28 LAB — LIPID PANEL
CHOL/HDL RATIO: 3
Cholesterol: 177 mg/dL (ref 0–200)
HDL: 64.7 mg/dL (ref >39.00)
LDL Cholesterol: 94 mg/dL (ref 0–99)
NONHDL: 112.73
Triglycerides: 95 mg/dL (ref 0.0–149.0)
VLDL: 19 mg/dL (ref 0.0–40.0)

## 2014-12-28 LAB — BASIC METABOLIC PANEL
BUN: 11 mg/dL (ref 6–23)
CALCIUM: 9.4 mg/dL (ref 8.4–10.5)
CHLORIDE: 103 meq/L (ref 96–112)
CO2: 30 meq/L (ref 19–32)
CREATININE: 0.8 mg/dL (ref 0.40–1.50)
GFR: 104.44 mL/min (ref >60.00)
GLUCOSE: 92 mg/dL (ref 70–99)
Potassium: 4.8 mEq/L (ref 3.5–5.1)
Sodium: 141 mEq/L (ref 135–145)

## 2014-12-28 LAB — CBC WITH DIFFERENTIAL/PLATELET
Basophils Absolute: 0 10*3/uL (ref 0.0–0.1)
Basophils Relative: 0.6 % (ref 0.0–3.0)
EOS PCT: 2.6 % (ref 0.0–5.0)
Eosinophils Absolute: 0.2 10*3/uL (ref 0.0–0.7)
HCT: 43.8 % (ref 39.0–52.0)
HEMOGLOBIN: 14.7 g/dL (ref 13.0–17.0)
Lymphocytes Relative: 29.2 % (ref 12.0–46.0)
Lymphs Abs: 2 10*3/uL (ref 0.7–4.0)
MCHC: 33.5 g/dL (ref 30.0–36.0)
MCV: 97.1 fl (ref 78.0–100.0)
MONO ABS: 0.8 10*3/uL (ref 0.1–1.0)
Monocytes Relative: 11.1 % (ref 3.0–12.0)
Neutro Abs: 3.9 10*3/uL (ref 1.4–7.7)
Neutrophils Relative %: 56.5 % (ref 43.0–77.0)
Platelets: 289 10*3/uL (ref 150.0–400.0)
RBC: 4.51 Mil/uL (ref 4.22–5.81)
RDW: 12.8 % (ref 11.5–15.5)
WBC: 6.8 10*3/uL (ref 4.0–10.5)

## 2014-12-28 LAB — HEPATIC FUNCTION PANEL
ALT: 17 U/L (ref 0–53)
AST: 18 U/L (ref 0–37)
Albumin: 4.7 g/dL (ref 3.5–5.2)
Alkaline Phosphatase: 80 U/L (ref 39–117)
BILIRUBIN TOTAL: 0.8 mg/dL (ref 0.2–1.2)
Bilirubin, Direct: 0.2 mg/dL (ref 0.0–0.3)
Total Protein: 7.5 g/dL (ref 6.0–8.3)

## 2014-12-28 LAB — PSA: PSA: 0.57 ng/mL (ref 0.10–4.00)

## 2014-12-28 LAB — TSH: TSH: 0.82 u[IU]/mL (ref 0.35–4.50)

## 2014-12-28 LAB — POCT INR: INR: 2.4

## 2014-12-28 NOTE — Progress Notes (Signed)
Agree with coumadin management. 

## 2014-12-28 NOTE — Progress Notes (Signed)
Pre visit review using our clinic review tool, if applicable. No additional management support is needed unless otherwise documented below in the visit note. 

## 2015-01-01 ENCOUNTER — Other Ambulatory Visit: Payer: Self-pay | Admitting: Family Medicine

## 2015-01-07 ENCOUNTER — Other Ambulatory Visit: Payer: Self-pay | Admitting: Family Medicine

## 2015-01-19 ENCOUNTER — Encounter: Payer: Self-pay | Admitting: Family Medicine

## 2015-01-19 ENCOUNTER — Ambulatory Visit (INDEPENDENT_AMBULATORY_CARE_PROVIDER_SITE_OTHER): Payer: 59 | Admitting: Family Medicine

## 2015-01-19 VITALS — BP 140/80 | HR 88 | Temp 98.5°F | Ht 70.0 in | Wt 175.0 lb

## 2015-01-19 DIAGNOSIS — Z0000 Encounter for general adult medical examination without abnormal findings: Secondary | ICD-10-CM | POA: Diagnosis not present

## 2015-01-19 DIAGNOSIS — G8929 Other chronic pain: Secondary | ICD-10-CM

## 2015-01-19 DIAGNOSIS — M25559 Pain in unspecified hip: Secondary | ICD-10-CM

## 2015-01-19 NOTE — Patient Instructions (Addendum)
Check on coverage for Xarelto or Eliquis- if interested in coming off Coumadin. Confirm date of last colonoscopy.

## 2015-01-19 NOTE — Progress Notes (Signed)
   Subjective:    Patient ID: Tyler Atkinson, male    DOB: 12-Jun-1955, 59 y.o.   MRN: [id number]  HPI Patient seen for complete physical. He thinks he had colonoscopy less than 10 years ago- we do not have confirmed date Already had flu vaccine.  He has history of hypothyroidism, ED, guttate psoriasis, history of deep vein thrombosis, chronic bilateral hip pains. Has been on chronic Coumadin for years. One time episode of DVT that he has been very reluctant to discontinue. INRs have been stable.  Quit smoking around 1987.  Past Medical History  Diagnosis Date  . Thyroid cancer ([place])   . Depression   . Hay fever     allergies  . Hypothyroidism   . Hyperlipidemia   . DVT of upper extremity (deep vein thrombosis) ([place]) 3/11   Past Surgical History  Procedure Laterality Date  . Thyroidectomy      2000  . Colonoscopy      01/27/2006. Normal    reports that he quit smoking about 29 years ago. He does not have any smokeless tobacco history on file. His alcohol and drug histories are not on file. family history is not on file. Allergies  Allergen Reactions  . Ezetimibe-Simvastatin     REACTION: rash X 2-3 months      Review of Systems  Constitutional: Negative for fatigue.  Eyes: Negative for visual disturbance.  Respiratory: Negative for cough, chest tightness and shortness of breath.   Cardiovascular: Negative for chest pain, palpitations and leg swelling.  Neurological: Negative for dizziness, syncope, weakness, light-headedness and headaches.       Objective:   Physical Exam  Constitutional: He is oriented to person, place, and time. He appears well-developed and well-nourished.  HENT:  Right Ear: External ear normal.  Left Ear: External ear normal.  Mouth/Throat: Oropharynx is clear and moist.  Eyes: Pupils are equal, round, and reactive to light.  Neck: Neck supple. No thyromegaly present.  Cardiovascular: Normal rate and regular rhythm.   Pulmonary/Chest:  Effort normal and breath sounds normal. No respiratory distress. He has no wheezes. He has no rales.  Musculoskeletal: He exhibits no edema.  Neurological: He is alert and oriented to person, place, and time.          Assessment & Plan:  Physical exam. Labs reviewed. No major concerns. He will check with insurance to see if he has coverage for Eliquis or Xarelto. Confirm date of last colonoscopy

## 2015-01-19 NOTE — Progress Notes (Signed)
Pre visit review using our clinic review tool, if applicable. No additional management support is needed unless otherwise documented below in the visit note. 

## 2015-02-07 ENCOUNTER — Telehealth: Payer: Self-pay | Admitting: Family Medicine

## 2015-02-07 MED ORDER — HYDROCODONE-ACETAMINOPHEN 5-325 MG PO TABS: 1.0000 | ORAL_TABLET | Freq: Four times a day (QID) | ORAL | Status: DC | PRN

## 2015-02-07 NOTE — Telephone Encounter (Signed)
CPE was 12/23 Last refill 11/29 #30 Please advise

## 2015-02-07 NOTE — Telephone Encounter (Signed)
Printed for signature

## 2015-02-07 NOTE — Telephone Encounter (Signed)
Refill OK

## 2015-02-07 NOTE — Telephone Encounter (Signed)
Pt requesting refill of HYDROcodone-acetaminophen (NORCO/VICODIN) 5-325 MG tablet

## 2015-02-08 ENCOUNTER — Ambulatory Visit (INDEPENDENT_AMBULATORY_CARE_PROVIDER_SITE_OTHER): Payer: 59 | Admitting: General Practice

## 2015-02-08 DIAGNOSIS — I82409 Acute embolism and thrombosis of unspecified deep veins of unspecified lower extremity: Secondary | ICD-10-CM | POA: Diagnosis not present

## 2015-02-08 LAB — POCT INR: INR: 2

## 2015-02-08 NOTE — Telephone Encounter (Signed)
Pt is aware via voicemail that RX is up front.

## 2015-02-08 NOTE — Progress Notes (Signed)
Pre visit review using our clinic review tool, if applicable. No additional management support is needed unless otherwise documented below in the visit note. 

## 2015-02-14 ENCOUNTER — Other Ambulatory Visit: Payer: Self-pay | Admitting: Family Medicine

## 2015-03-21 ENCOUNTER — Ambulatory Visit: Payer: 59

## 2015-03-26 ENCOUNTER — Telehealth: Payer: Self-pay | Admitting: Family Medicine

## 2015-03-26 NOTE — Telephone Encounter (Signed)
° ° ° ° ° °  Pt request refill of the following: ° °HYDROcodone-acetaminophen (NORCO/VICODIN) 5-325 MG tablet ° ° °Phamacy: °

## 2015-03-26 NOTE — Telephone Encounter (Signed)
OK 

## 2015-03-26 NOTE — Telephone Encounter (Signed)
Patient was last seen 01/19/15 - Ok to refill this medication?

## 2015-03-27 MED ORDER — HYDROCODONE-ACETAMINOPHEN 5-325 MG PO TABS: 1.0000 | ORAL_TABLET | Freq: Four times a day (QID) | ORAL | Status: DC | PRN

## 2015-03-27 NOTE — Telephone Encounter (Signed)
Pt is aware via voicemail that Rx is print and up front for pick up.

## 2015-03-27 NOTE — Telephone Encounter (Signed)
Printed for signature

## 2015-03-28 ENCOUNTER — Telehealth: Payer: Self-pay | Admitting: Family Medicine

## 2015-03-28 ENCOUNTER — Ambulatory Visit (INDEPENDENT_AMBULATORY_CARE_PROVIDER_SITE_OTHER): Payer: 59 | Admitting: General Practice

## 2015-03-28 DIAGNOSIS — I82409 Acute embolism and thrombosis of unspecified deep veins of unspecified lower extremity: Secondary | ICD-10-CM

## 2015-03-28 LAB — POCT INR: INR: 2.2

## 2015-03-28 MED ORDER — ROSUVASTATIN CALCIUM 5 MG PO TABS: 5.0000 mg | ORAL_TABLET | Freq: Every day | ORAL | Status: DC

## 2015-03-28 MED ORDER — LEVOTHYROXINE SODIUM 88 MCG PO TABS: ORAL_TABLET | ORAL | Status: DC

## 2015-03-28 MED ORDER — SERTRALINE HCL 50 MG PO TABS: ORAL_TABLET | ORAL | Status: DC

## 2015-03-28 MED ORDER — WARFARIN SODIUM 5 MG PO TABS: 5.0000 mg | ORAL_TABLET | Freq: Every day | ORAL | Status: DC

## 2015-03-28 MED ORDER — LIOTHYRONINE SODIUM 25 MCG PO TABS
25.0000 ug | ORAL_TABLET | Freq: Every day | ORAL | Status: DC
Start: 2015-03-28 — End: 2015-04-06

## 2015-03-28 NOTE — Progress Notes (Signed)
Pre visit review using our clinic review tool, if applicable. No additional management support is needed unless otherwise documented below in the visit note. 

## 2015-03-28 NOTE — Telephone Encounter (Signed)
Rx was send to the pharmacy

## 2015-03-28 NOTE — Telephone Encounter (Signed)
Patient needs [facility] taken off his chart, no longer will be using that pharmacy.  Please add [facility] as his new default pharmacy.  [facility] [place] [facility].  Pt needs a 30 day supply of his [facility] [place]  Pt needs a 90 day supply of his medications sent to [facility]:  Levothyroxine Sodium  Liothyronine Sodium  Rosuvastatin Calcium  Sertraline HCI  Warfarin Sodium

## 2015-04-04 ENCOUNTER — Telehealth: Payer: Self-pay | Admitting: Family Medicine

## 2015-04-04 ENCOUNTER — Other Ambulatory Visit: Payer: Self-pay | Admitting: Family Medicine

## 2015-04-04 MED ORDER — LEVOTHYROXINE SODIUM 88 MCG PO TABS: ORAL_TABLET | ORAL | Status: DC

## 2015-04-04 NOTE — Telephone Encounter (Signed)
[facility]  needs clarification on levothyroxine

## 2015-04-06 ENCOUNTER — Other Ambulatory Visit: Payer: Self-pay | Admitting: Family Medicine

## 2015-04-06 MED ORDER — LIOTHYRONINE SODIUM 25 MCG PO TABS: 25.0000 ug | ORAL_TABLET | Freq: Every day | ORAL | Status: DC

## 2015-05-01 ENCOUNTER — Other Ambulatory Visit: Payer: Self-pay | Admitting: Family Medicine

## 2015-05-09 ENCOUNTER — Ambulatory Visit (INDEPENDENT_AMBULATORY_CARE_PROVIDER_SITE_OTHER): Payer: 59 | Admitting: General Practice

## 2015-05-09 DIAGNOSIS — I82409 Acute embolism and thrombosis of unspecified deep veins of unspecified lower extremity: Secondary | ICD-10-CM | POA: Diagnosis not present

## 2015-05-09 LAB — POCT INR: INR: 3.3

## 2015-05-09 NOTE — Progress Notes (Signed)
Pre visit review using our clinic review tool, if applicable. No additional management support is needed unless otherwise documented below in the visit note. 

## 2015-05-23 ENCOUNTER — Telehealth: Payer: Self-pay | Admitting: Family Medicine

## 2015-05-23 NOTE — Telephone Encounter (Signed)
Last refill 03/27/2015 #30 Last OV 01/19/2015  No Pending appt

## 2015-05-23 NOTE — Telephone Encounter (Signed)
Pt needs new rx hydrocodone °

## 2015-05-24 MED ORDER — HYDROCODONE-ACETAMINOPHEN 5-325 MG PO TABS: 1.0000 | ORAL_TABLET | Freq: Four times a day (QID) | ORAL | Status: DC | PRN

## 2015-05-24 NOTE — Telephone Encounter (Signed)
Refill OK

## 2015-05-24 NOTE — Telephone Encounter (Signed)
Pt is aware via voicemail that Rx is up front for pick up.

## 2015-06-20 ENCOUNTER — Ambulatory Visit (INDEPENDENT_AMBULATORY_CARE_PROVIDER_SITE_OTHER): Payer: 59 | Admitting: General Practice

## 2015-06-20 DIAGNOSIS — I82409 Acute embolism and thrombosis of unspecified deep veins of unspecified lower extremity: Secondary | ICD-10-CM | POA: Diagnosis not present

## 2015-06-20 LAB — POCT INR: INR: 3.3

## 2015-06-20 NOTE — Progress Notes (Signed)
Pre visit review using our clinic review tool, if applicable. No additional management support is needed unless otherwise documented below in the visit note. 

## 2015-07-11 ENCOUNTER — Telehealth: Payer: Self-pay | Admitting: Family Medicine

## 2015-07-11 MED ORDER — HYDROCODONE-ACETAMINOPHEN 5-325 MG PO TABS: 1.0000 | ORAL_TABLET | Freq: Four times a day (QID) | ORAL | Status: DC | PRN

## 2015-07-11 NOTE — Telephone Encounter (Signed)
Last refill 03/27/2015 #30 Last OV 01/19/2015  No Pending appt

## 2015-07-11 NOTE — Telephone Encounter (Signed)
Refill once 

## 2015-07-11 NOTE — Telephone Encounter (Signed)
° ° ° ° ° °  Pt request refill of the following: ° °HYDROcodone-acetaminophen (NORCO/VICODIN) 5-325 MG tablet ° ° °Phamacy: °

## 2015-07-11 NOTE — Telephone Encounter (Signed)
Pt is aware via voicemail that RX is up front for pick up. 

## 2015-08-01 ENCOUNTER — Ambulatory Visit (INDEPENDENT_AMBULATORY_CARE_PROVIDER_SITE_OTHER): Payer: 59 | Admitting: General Practice

## 2015-08-01 DIAGNOSIS — I82409 Acute embolism and thrombosis of unspecified deep veins of unspecified lower extremity: Secondary | ICD-10-CM | POA: Diagnosis not present

## 2015-08-01 LAB — POCT INR: INR: 1.6

## 2015-08-01 NOTE — Progress Notes (Signed)
Pre visit review using our clinic review tool, if applicable. No additional management support is needed unless otherwise documented below in the visit note. 

## 2015-08-29 ENCOUNTER — Ambulatory Visit: Payer: 59

## 2015-09-04 ENCOUNTER — Telehealth: Payer: Self-pay | Admitting: Family Medicine

## 2015-09-04 NOTE — Telephone Encounter (Signed)
° °  Pt req refill    HYDROcodone-acetaminophen (NORCO/VICODIN) 5-325 MG tablet

## 2015-09-05 ENCOUNTER — Ambulatory Visit (INDEPENDENT_AMBULATORY_CARE_PROVIDER_SITE_OTHER): Payer: 59 | Admitting: General Practice

## 2015-09-05 DIAGNOSIS — I82409 Acute embolism and thrombosis of unspecified deep veins of unspecified lower extremity: Secondary | ICD-10-CM | POA: Diagnosis not present

## 2015-09-05 LAB — POCT INR: INR: 2.6

## 2015-09-05 MED ORDER — HYDROCODONE-ACETAMINOPHEN 5-325 MG PO TABS: 1.0000 | ORAL_TABLET | Freq: Four times a day (QID) | ORAL | 0 refills | Status: DC | PRN

## 2015-09-05 NOTE — Telephone Encounter (Signed)
Last seen on 01/19/2015 CPE Last refill 07/11/2015 #30 Please advise

## 2015-09-05 NOTE — Telephone Encounter (Signed)
Pt aware via voicemail. Script up front.

## 2015-09-05 NOTE — Telephone Encounter (Signed)
Refill okay?  

## 2015-10-03 ENCOUNTER — Ambulatory Visit (INDEPENDENT_AMBULATORY_CARE_PROVIDER_SITE_OTHER): Payer: 59 | Admitting: General Practice

## 2015-10-03 DIAGNOSIS — I82409 Acute embolism and thrombosis of unspecified deep veins of unspecified lower extremity: Secondary | ICD-10-CM

## 2015-10-03 LAB — POCT INR: INR: 3

## 2015-10-17 ENCOUNTER — Telehealth: Payer: Self-pay | Admitting: Family Medicine

## 2015-10-17 NOTE — Telephone Encounter (Signed)
Pt request refill (s) 1. HYDROcodone-acetaminophen (NORCO/VICODIN) 5-325 MG tablet   2. liothyronine (CYTOMEL) 25 MCG tablet Sent to [facility] [place]

## 2015-10-19 NOTE — Telephone Encounter (Signed)
Last refill 09/05/2015 #30 Last OV 01/19/2016 Please advise on Hydrocodone

## 2015-10-21 NOTE — Telephone Encounter (Signed)
Refill OK

## 2015-10-22 MED ORDER — LIOTHYRONINE SODIUM 25 MCG PO TABS: 25.0000 ug | ORAL_TABLET | Freq: Every day | ORAL | 5 refills | Status: DC

## 2015-10-22 MED ORDER — HYDROCODONE-ACETAMINOPHEN 5-325 MG PO TABS: 1.0000 | ORAL_TABLET | Freq: Four times a day (QID) | ORAL | 0 refills | Status: DC | PRN

## 2015-10-22 NOTE — Telephone Encounter (Signed)
Medication is ready for pick and he is aware.

## 2015-10-22 NOTE — Telephone Encounter (Signed)
Script is up front for pick up .

## 2015-10-22 NOTE — Telephone Encounter (Signed)
Tried calling pt with No Answer.

## 2015-10-22 NOTE — Telephone Encounter (Signed)
Printed for signature

## 2015-10-31 ENCOUNTER — Ambulatory Visit (INDEPENDENT_AMBULATORY_CARE_PROVIDER_SITE_OTHER): Payer: 59 | Admitting: General Practice

## 2015-10-31 DIAGNOSIS — Z23 Encounter for immunization: Secondary | ICD-10-CM | POA: Diagnosis not present

## 2015-10-31 LAB — POCT INR: INR: 1.7

## 2015-11-28 ENCOUNTER — Ambulatory Visit (INDEPENDENT_AMBULATORY_CARE_PROVIDER_SITE_OTHER): Payer: 59 | Admitting: General Practice

## 2015-11-28 LAB — POCT INR: INR: 3.5

## 2015-11-28 NOTE — Patient Instructions (Addendum)
Pre visit review using our clinic review tool, if applicable. No additional management support is needed unless otherwise documented below in the visit note.  Patient INR elevated today.  Patient denies extra doses but has had a dose dexamethasone today.  Diet has been inconsistent.  Today 1 dose held and then will resume normal dosage and will recheck in 4 weeks.  Did reiterate the risks of a supratherapeutic INR, instructed patient to report any unusual bleeding.  Patient verbalizes understanding.

## 2015-12-14 ENCOUNTER — Telehealth: Payer: Self-pay | Admitting: Family Medicine

## 2015-12-14 NOTE — Telephone Encounter (Signed)
Pt request refill  °HYDROcodone-acetaminophen (NORCO/VICODIN) 5-325 MG tablet °

## 2015-12-14 NOTE — Telephone Encounter (Signed)
Last OV 01/19/2015 Last refill 10/22/2015 #30, 0rf Please advise

## 2015-12-16 NOTE — Telephone Encounter (Signed)
Refill OK

## 2015-12-17 MED ORDER — HYDROCODONE-ACETAMINOPHEN 5-325 MG PO TABS: 1.0000 | ORAL_TABLET | Freq: Four times a day (QID) | ORAL | 0 refills | Status: DC | PRN

## 2015-12-17 NOTE — Telephone Encounter (Signed)
Pt is aware via voicemail that script is up front for pick up. 

## 2015-12-25 ENCOUNTER — Encounter: Payer: 59 | Admitting: Family Medicine

## 2015-12-26 ENCOUNTER — Ambulatory Visit (INDEPENDENT_AMBULATORY_CARE_PROVIDER_SITE_OTHER): Payer: 59 | Admitting: General Practice

## 2015-12-26 DIAGNOSIS — I82409 Acute embolism and thrombosis of unspecified deep veins of unspecified lower extremity: Secondary | ICD-10-CM

## 2015-12-26 LAB — POCT INR: INR: 2

## 2015-12-26 NOTE — Patient Instructions (Signed)
Pre visit review using our clinic review tool, if applicable. No additional management support is needed unless otherwise documented below in the visit note. 

## 2016-01-02 ENCOUNTER — Other Ambulatory Visit (INDEPENDENT_AMBULATORY_CARE_PROVIDER_SITE_OTHER): Payer: 59

## 2016-01-02 DIAGNOSIS — Z0000 Encounter for general adult medical examination without abnormal findings: Secondary | ICD-10-CM | POA: Diagnosis not present

## 2016-01-02 LAB — CBC WITH DIFFERENTIAL/PLATELET
BASOS ABS: 0.1 10*3/uL (ref 0.0–0.1)
Basophils Relative: 1 % (ref 0.0–3.0)
EOS ABS: 0.2 10*3/uL (ref 0.0–0.7)
Eosinophils Relative: 3.5 % (ref 0.0–5.0)
HCT: 40.6 % (ref 39.0–52.0)
Hemoglobin: 13.7 g/dL (ref 13.0–17.0)
LYMPHS ABS: 2.1 10*3/uL (ref 0.7–4.0)
Lymphocytes Relative: 32.2 % (ref 12.0–46.0)
MCHC: 33.9 g/dL (ref 30.0–36.0)
MCV: 96 fl (ref 78.0–100.0)
MONO ABS: 0.8 10*3/uL (ref 0.1–1.0)
MONOS PCT: 12.5 % — AB (ref 3.0–12.0)
NEUTROS ABS: 3.2 10*3/uL (ref 1.4–7.7)
NEUTROS PCT: 50.8 % (ref 43.0–77.0)
PLATELETS: 284 10*3/uL (ref 150.0–400.0)
RBC: 4.23 Mil/uL (ref 4.22–5.81)
RDW: 12.8 % (ref 11.5–15.5)
WBC: 6.4 10*3/uL (ref 4.0–10.5)

## 2016-01-02 LAB — LIPID PANEL
Cholesterol: 223 mg/dL — ABNORMAL HIGH (ref 0–200)
HDL: 66.9 mg/dL (ref >39.00)
LDL Cholesterol: 138 mg/dL — ABNORMAL HIGH (ref 0–99)
NONHDL: 156.08
Total CHOL/HDL Ratio: 3
Triglycerides: 92 mg/dL (ref 0.0–149.0)
VLDL: 18.4 mg/dL (ref 0.0–40.0)

## 2016-01-02 LAB — HEPATIC FUNCTION PANEL
ALBUMIN: 4.5 g/dL (ref 3.5–5.2)
ALK PHOS: 68 U/L (ref 39–117)
ALT: 15 U/L (ref 0–53)
AST: 17 U/L (ref 0–37)
Bilirubin, Direct: 0.1 mg/dL (ref 0.0–0.3)
Total Bilirubin: 0.7 mg/dL (ref 0.2–1.2)
Total Protein: 7 g/dL (ref 6.0–8.3)

## 2016-01-02 LAB — BASIC METABOLIC PANEL
BUN: 13 mg/dL (ref 6–23)
CALCIUM: 9 mg/dL (ref 8.4–10.5)
CO2: 30 mEq/L (ref 19–32)
CREATININE: 0.78 mg/dL (ref 0.40–1.50)
Chloride: 103 mEq/L (ref 96–112)
GFR: 107.18 mL/min (ref >60.00)
GLUCOSE: 92 mg/dL (ref 70–99)
Potassium: 4.1 mEq/L (ref 3.5–5.1)
SODIUM: 140 meq/L (ref 135–145)

## 2016-01-02 LAB — PSA: PSA: 0.57 ng/mL (ref 0.10–4.00)

## 2016-01-02 LAB — TSH: TSH: 0.33 u[IU]/mL — AB (ref 0.35–4.50)

## 2016-01-23 ENCOUNTER — Ambulatory Visit (INDEPENDENT_AMBULATORY_CARE_PROVIDER_SITE_OTHER): Payer: 59 | Admitting: Family Medicine

## 2016-01-23 ENCOUNTER — Encounter: Payer: Self-pay | Admitting: Family Medicine

## 2016-01-23 VITALS — BP 138/70 | HR 83 | Temp 98.3°F | Ht 70.0 in | Wt 173.0 lb

## 2016-01-23 DIAGNOSIS — E039 Hypothyroidism, unspecified: Secondary | ICD-10-CM | POA: Diagnosis not present

## 2016-01-23 DIAGNOSIS — Z23 Encounter for immunization: Secondary | ICD-10-CM

## 2016-01-23 DIAGNOSIS — Z0000 Encounter for general adult medical examination without abnormal findings: Secondary | ICD-10-CM

## 2016-01-23 NOTE — Progress Notes (Signed)
Subjective:     Patient ID: Tyler Atkinson, male   DOB: [birth year], 60 y.o.   MRN: [id number]  HPI Patient seen for complete physical. He has remote history of left upper extremity DVT, osteoarthritis, history of guttate psoriasis, hypothyroidism, hyperlipidemia, history of recurrent depression. He remains on Coumadin and we had gone over options of possibly discontinuing with 1 prior history of unprovoked DVT but he's been very reluctant. Given the fact this was unprovoked DVT we also had discussed with him fairly high risk of recurrence and thus we have continued regular Coumadin. He gets this checked very regularly. He is overdue for colonoscopy. He states he's is not sure he wishes to get follow up colonoscopy at this time. We did discuss possibility of cologuard.  He is taking thyroid replacement regularly. Also on Crestor 5 mg daily. He's been on Zoloft for several years and mood is stable. Drinks about 8 ounces of wine per day. Overdue for tetanus booster. Flu vaccine already given.  Past Medical History:  Diagnosis Date  . Depression   . DVT of upper extremity (deep vein thrombosis) ([place]) 3/11  . Hay fever    allergies  . Hyperlipidemia   . Hypothyroidism   . Thyroid cancer [facility])    Past Surgical History:  Procedure Laterality Date  . COLONOSCOPY     01/27/2006. Normal  . THYROIDECTOMY     2000    reports that he quit smoking about 31 years ago. He has quit using smokeless tobacco. He reports that he drinks alcohol. He reports that he does not use drugs. family history includes Alzheimer's disease in his mother; Hypertension in his mother. Allergies  Allergen Reactions  . Ezetimibe-Simvastatin     REACTION: rash X 2-3 months     Review of Systems  Constitutional: Negative for activity change, appetite change, fatigue and fever.  HENT: Negative for congestion, ear pain and trouble swallowing.   Eyes: Negative for pain and visual disturbance.  Respiratory: Negative for  cough, chest tightness, shortness of breath and wheezing.   Cardiovascular: Negative for chest pain, palpitations and leg swelling.  Gastrointestinal: Negative for abdominal distention, abdominal pain, blood in stool, constipation, diarrhea, nausea, rectal pain and vomiting.  Endocrine: Negative for polydipsia and polyuria.  Genitourinary: Negative for dysuria, hematuria and testicular pain.  Musculoskeletal: Positive for arthralgias. Negative for joint swelling.  Skin: Negative for rash.  Neurological: Negative for dizziness, syncope, weakness, light-headedness and headaches.  Hematological: Negative for adenopathy.  Psychiatric/Behavioral: Negative for confusion and dysphoric mood.       Objective:   Physical Exam  Constitutional: He is oriented to person, place, and time. He appears well-developed and well-nourished. No distress.  HENT:  Head: Normocephalic and atraumatic.  Right Ear: External ear normal.  Left Ear: External ear normal.  Mouth/Throat: Oropharynx is clear and moist.  Eyes: Conjunctivae and EOM are normal. Pupils are equal, round, and reactive to light.  Neck: Normal range of motion. Neck supple. No thyromegaly present.  Cardiovascular: Normal rate, regular rhythm and normal heart sounds.   No murmur heard. Pulmonary/Chest: No respiratory distress. He has no wheezes. He has no rales.  Abdominal: Soft. Bowel sounds are normal. He exhibits no distension and no mass. There is no tenderness. There is no rebound and no guarding.  Musculoskeletal: He exhibits no edema.  Lymphadenopathy:    He has no cervical adenopathy.  Neurological: He is alert and oriented to person, place, and time. He displays normal reflexes. No cranial nerve  deficit.  Skin: No rash noted.  He has multiple scattered seborrheic keratoses  Psychiatric: He has a normal mood and affect.       Assessment:     Complete physical. Patient needs follow-up colon cancer screening. Labs reviewed. His TSH  is very minimally below limit of normal value    Plan:     -Tetanus booster given -Repeat free T4 and TSH in about 3 months. If TSH still low that point reduce Synthroid dosage -Continue regular follow-up with Coumadin clinic -We discussed pros and cons of DNA base stool testing for colon cancer versus colonoscopy. He is very interested in trying Cologuard will check on insurance coverage  Eulas Post MD [facility] at [facility]

## 2016-01-23 NOTE — Patient Instructions (Signed)
Set up repeat thyroid studies in about 3 months Consider Cologuard and let us know if interested

## 2016-01-23 NOTE — Progress Notes (Signed)
Pre visit review using our clinic review tool, if applicable. No additional management support is needed unless otherwise documented below in the visit note. 

## 2016-01-30 ENCOUNTER — Ambulatory Visit (INDEPENDENT_AMBULATORY_CARE_PROVIDER_SITE_OTHER): Payer: 59 | Admitting: General Practice

## 2016-01-30 DIAGNOSIS — I82409 Acute embolism and thrombosis of unspecified deep veins of unspecified lower extremity: Secondary | ICD-10-CM | POA: Diagnosis not present

## 2016-01-30 LAB — POCT INR: INR: 1.6

## 2016-01-30 NOTE — Patient Instructions (Signed)
Pre visit review using our clinic review tool, if applicable. No additional management support is needed unless otherwise documented below in the visit note. 

## 2016-02-04 ENCOUNTER — Telehealth: Payer: Self-pay | Admitting: Family Medicine

## 2016-02-04 NOTE — Telephone Encounter (Signed)
Refill OK

## 2016-02-04 NOTE — Telephone Encounter (Signed)
Last OV 01/23/2016 Last refill 12-17-2015 #30, 0rf Please advise

## 2016-02-04 NOTE — Telephone Encounter (Signed)
° ° ° ° ° °  Pt request refill of the following: ° °HYDROcodone-acetaminophen (NORCO/VICODIN) 5-325 MG tablet ° ° °Phamacy: °

## 2016-02-05 MED ORDER — HYDROCODONE-ACETAMINOPHEN 5-325 MG PO TABS: 1.0000 | ORAL_TABLET | Freq: Four times a day (QID) | ORAL | 0 refills | Status: DC | PRN

## 2016-02-05 NOTE — Telephone Encounter (Signed)
Pt is aware via voicemail that script is up front for pick up. 

## 2016-02-05 NOTE — Telephone Encounter (Signed)
Printed for signature

## 2016-02-22 ENCOUNTER — Other Ambulatory Visit: Payer: Self-pay

## 2016-02-22 MED ORDER — LEVOTHYROXINE SODIUM 88 MCG PO TABS: ORAL_TABLET | ORAL | 1 refills | Status: DC

## 2016-02-27 ENCOUNTER — Ambulatory Visit (INDEPENDENT_AMBULATORY_CARE_PROVIDER_SITE_OTHER): Payer: 59 | Admitting: General Practice

## 2016-02-27 DIAGNOSIS — Z5181 Encounter for therapeutic drug level monitoring: Secondary | ICD-10-CM | POA: Diagnosis not present

## 2016-02-27 LAB — POCT INR: INR: 2.3

## 2016-02-27 NOTE — Patient Instructions (Signed)
Pre visit review using our clinic review tool, if applicable. No additional management support is needed unless otherwise documented below in the visit note. 

## 2016-03-26 ENCOUNTER — Ambulatory Visit (INDEPENDENT_AMBULATORY_CARE_PROVIDER_SITE_OTHER): Payer: 59

## 2016-03-26 DIAGNOSIS — I82409 Acute embolism and thrombosis of unspecified deep veins of unspecified lower extremity: Secondary | ICD-10-CM | POA: Diagnosis not present

## 2016-03-26 LAB — POCT INR: INR: 1.6

## 2016-03-26 NOTE — Progress Notes (Signed)
I agree with this plan.

## 2016-03-26 NOTE — Patient Instructions (Signed)
Pre visit review using our clinic review tool, if applicable. No additional management support is needed unless otherwise documented below in the visit note. 

## 2016-03-27 ENCOUNTER — Other Ambulatory Visit: Payer: Self-pay | Admitting: Family Medicine

## 2016-03-28 ENCOUNTER — Telehealth: Payer: Self-pay | Admitting: Family Medicine

## 2016-03-28 NOTE — Telephone Encounter (Signed)
Pt request refill  °HYDROcodone-acetaminophen (NORCO/VICODIN) 5-325 MG tablet °

## 2016-03-31 NOTE — Telephone Encounter (Signed)
Last refill was 02/05/16.  Last office visit/CPE 01/23/16.  Okay to fill?

## 2016-03-31 NOTE — Telephone Encounter (Signed)
Refill once 

## 2016-04-01 MED ORDER — HYDROCODONE-ACETAMINOPHEN 5-325 MG PO TABS: 1.0000 | ORAL_TABLET | Freq: Four times a day (QID) | ORAL | 0 refills | Status: DC | PRN

## 2016-04-02 NOTE — Telephone Encounter (Signed)
Rx ready for pick up and patient is aware 

## 2016-04-08 ENCOUNTER — Telehealth: Payer: Self-pay | Admitting: Family Medicine

## 2016-04-08 MED ORDER — ROSUVASTATIN CALCIUM 5 MG PO TABS: 5.0000 mg | ORAL_TABLET | Freq: Every day | ORAL | 1 refills | Status: DC

## 2016-04-08 MED ORDER — LIOTHYRONINE SODIUM 25 MCG PO TABS: 25.0000 ug | ORAL_TABLET | Freq: Every day | ORAL | 1 refills | Status: DC

## 2016-04-08 NOTE — Telephone Encounter (Signed)
Pt need new Rx for CYTOMEL and CRESTOR  Pharm:  [facility]

## 2016-04-08 NOTE — Telephone Encounter (Signed)
Refill sent.

## 2016-04-16 ENCOUNTER — Ambulatory Visit (INDEPENDENT_AMBULATORY_CARE_PROVIDER_SITE_OTHER): Payer: 59 | Admitting: General Practice

## 2016-04-16 ENCOUNTER — Other Ambulatory Visit (INDEPENDENT_AMBULATORY_CARE_PROVIDER_SITE_OTHER): Payer: 59

## 2016-04-16 DIAGNOSIS — E039 Hypothyroidism, unspecified: Secondary | ICD-10-CM

## 2016-04-16 DIAGNOSIS — I82409 Acute embolism and thrombosis of unspecified deep veins of unspecified lower extremity: Secondary | ICD-10-CM

## 2016-04-16 LAB — T4, FREE: FREE T4: 0.79 ng/dL (ref 0.60–1.60)

## 2016-04-16 LAB — TSH: TSH: 1.26 u[IU]/mL (ref 0.35–4.50)

## 2016-04-16 LAB — POCT INR: INR: 2.4

## 2016-04-16 NOTE — Patient Instructions (Signed)
Pre visit review using our clinic review tool, if applicable. No additional management support is needed unless otherwise documented below in the visit note. 

## 2016-05-13 ENCOUNTER — Encounter: Payer: Self-pay | Admitting: Gastroenterology

## 2016-05-14 ENCOUNTER — Ambulatory Visit (INDEPENDENT_AMBULATORY_CARE_PROVIDER_SITE_OTHER): Payer: 59 | Admitting: General Practice

## 2016-05-14 DIAGNOSIS — I82409 Acute embolism and thrombosis of unspecified deep veins of unspecified lower extremity: Secondary | ICD-10-CM

## 2016-05-14 LAB — POCT INR: INR: 3.2

## 2016-05-14 NOTE — Patient Instructions (Signed)
Pre visit review using our clinic review tool, if applicable. No additional management support is needed unless otherwise documented below in the visit note. 

## 2016-05-19 ENCOUNTER — Telehealth: Payer: Self-pay | Admitting: Family Medicine

## 2016-05-19 NOTE — Telephone Encounter (Signed)
Pt request refill  °HYDROcodone-acetaminophen (NORCO/VICODIN) 5-325 MG tablet °

## 2016-05-20 ENCOUNTER — Other Ambulatory Visit: Payer: Self-pay

## 2016-05-20 MED ORDER — HYDROCODONE-ACETAMINOPHEN 5-325 MG PO TABS: 1.0000 | ORAL_TABLET | Freq: Four times a day (QID) | ORAL | 0 refills | Status: DC | PRN

## 2016-05-20 NOTE — Telephone Encounter (Signed)
Rx for HYDROcodone  Has been signed and ready for pick up in the front office, patient notified and is aware.

## 2016-05-20 NOTE — Telephone Encounter (Signed)
HYDROcodone-acetaminophen (NORCO/VICODIN) 5-325 MG tablet last refill 04/01/16 and last office visit 01/23/16.  Okay to fill?

## 2016-05-20 NOTE — Telephone Encounter (Signed)
Refill once and set up follow up .Marland Kitchen

## 2016-06-11 ENCOUNTER — Ambulatory Visit (INDEPENDENT_AMBULATORY_CARE_PROVIDER_SITE_OTHER): Payer: 59 | Admitting: General Practice

## 2016-06-11 DIAGNOSIS — Z5181 Encounter for therapeutic drug level monitoring: Secondary | ICD-10-CM | POA: Diagnosis not present

## 2016-06-11 LAB — POCT INR: INR: 4.7

## 2016-06-11 NOTE — Patient Instructions (Signed)
Pre visit review using our clinic review tool, if applicable. No additional management support is needed unless otherwise documented below in the visit note. 

## 2016-06-11 NOTE — Progress Notes (Signed)
I have reviewed and agree with the plan. 

## 2016-07-02 ENCOUNTER — Ambulatory Visit (INDEPENDENT_AMBULATORY_CARE_PROVIDER_SITE_OTHER): Payer: 59 | Admitting: General Practice

## 2016-07-02 DIAGNOSIS — Z5181 Encounter for therapeutic drug level monitoring: Secondary | ICD-10-CM | POA: Diagnosis not present

## 2016-07-02 LAB — POCT INR: INR: 2.8

## 2016-07-02 NOTE — Patient Instructions (Signed)
Pre visit review using our clinic review tool, if applicable. No additional management support is needed unless otherwise documented below in the visit note. 

## 2016-07-09 ENCOUNTER — Other Ambulatory Visit: Payer: Self-pay | Admitting: Family Medicine

## 2016-07-09 MED ORDER — HYDROCODONE-ACETAMINOPHEN 5-325 MG PO TABS: 1.0000 | ORAL_TABLET | Freq: Four times a day (QID) | ORAL | 0 refills | Status: DC | PRN

## 2016-07-09 NOTE — Telephone Encounter (Signed)
Pt needs new rx hydrocodone °

## 2016-07-09 NOTE — Telephone Encounter (Signed)
Refill OK

## 2016-07-09 NOTE — Telephone Encounter (Signed)
Last refill was 05/20/16 and last office visit was 01/23/16.  Okay to fill?

## 2016-07-11 MED ORDER — HYDROCODONE-ACETAMINOPHEN 5-325 MG PO TABS: 1.0000 | ORAL_TABLET | Freq: Four times a day (QID) | ORAL | 0 refills | Status: DC | PRN

## 2016-07-11 NOTE — Telephone Encounter (Signed)
I called the pt and left a detailed message the Rx was left at the front desk for him to pick up.

## 2016-08-06 ENCOUNTER — Ambulatory Visit (INDEPENDENT_AMBULATORY_CARE_PROVIDER_SITE_OTHER): Payer: 59 | Admitting: General Practice

## 2016-08-06 DIAGNOSIS — Z5181 Encounter for therapeutic drug level monitoring: Secondary | ICD-10-CM | POA: Diagnosis not present

## 2016-08-06 LAB — POCT INR: INR: 1.4

## 2016-08-06 NOTE — Patient Instructions (Signed)
Pre visit review using our clinic review tool, if applicable. No additional management support is needed unless otherwise documented below in the visit note. 

## 2016-08-26 ENCOUNTER — Telehealth: Payer: Self-pay | Admitting: Family Medicine

## 2016-08-26 NOTE — Telephone Encounter (Signed)
Last refill 07/11/16 and last office visit 01/23/16.  Okay to fill?

## 2016-08-26 NOTE — Telephone Encounter (Signed)
Refill OK

## 2016-08-26 NOTE — Telephone Encounter (Signed)
Pt request refill  HYDROcodone-acetaminophen (NORCO/VICODIN) 5-325 MG

## 2016-08-27 ENCOUNTER — Ambulatory Visit (INDEPENDENT_AMBULATORY_CARE_PROVIDER_SITE_OTHER): Payer: 59 | Admitting: General Practice

## 2016-08-27 DIAGNOSIS — Z5181 Encounter for therapeutic drug level monitoring: Secondary | ICD-10-CM | POA: Diagnosis not present

## 2016-08-27 LAB — POCT INR: INR: 1.7

## 2016-08-27 MED ORDER — HYDROCODONE-ACETAMINOPHEN 5-325 MG PO TABS: 1.0000 | ORAL_TABLET | Freq: Four times a day (QID) | ORAL | 0 refills | Status: DC | PRN

## 2016-08-27 NOTE — Patient Instructions (Signed)
Pre visit review using our clinic review tool, if applicable. No additional management support is needed unless otherwise documented below in the visit note. 

## 2016-08-27 NOTE — Telephone Encounter (Signed)
Attempted to call patient but unable to leave a message due to "voicemail box being full".  Rx ready for pick up.

## 2016-09-23 NOTE — Patient Instructions (Signed)
Pre visit review using our clinic review tool, if applicable. No additional management support is needed unless otherwise documented below in the visit note. 

## 2016-09-24 ENCOUNTER — Ambulatory Visit (INDEPENDENT_AMBULATORY_CARE_PROVIDER_SITE_OTHER): Payer: 59 | Admitting: General Practice

## 2016-09-24 ENCOUNTER — Other Ambulatory Visit: Payer: Self-pay | Admitting: General Practice

## 2016-09-24 DIAGNOSIS — Z5181 Encounter for therapeutic drug level monitoring: Secondary | ICD-10-CM | POA: Diagnosis not present

## 2016-09-24 LAB — POCT INR: INR: 4.4

## 2016-09-24 MED ORDER — WARFARIN SODIUM 5 MG PO TABS
ORAL_TABLET | ORAL | 1 refills | Status: DC
Start: 2016-09-24 — End: 2017-01-30

## 2016-10-22 ENCOUNTER — Ambulatory Visit (INDEPENDENT_AMBULATORY_CARE_PROVIDER_SITE_OTHER): Payer: 59 | Admitting: General Practice

## 2016-10-22 DIAGNOSIS — Z5181 Encounter for therapeutic drug level monitoring: Secondary | ICD-10-CM | POA: Diagnosis not present

## 2016-10-22 DIAGNOSIS — Z7901 Long term (current) use of anticoagulants: Secondary | ICD-10-CM

## 2016-10-22 LAB — POCT INR: INR: 3.9

## 2016-10-22 NOTE — Patient Instructions (Signed)
Pre visit review using our clinic review tool, if applicable. No additional management support is needed unless otherwise documented below in the visit note. 

## 2016-10-23 ENCOUNTER — Telehealth: Payer: Self-pay | Admitting: Family Medicine

## 2016-10-23 NOTE — Telephone Encounter (Signed)
Pt need new Rx for Hydrocodone  Pt is aware of 3 business days for refills and someone will call when ready for pick up.  Pt is aware that Dr. Elease Hashimoto is not in the office today.

## 2016-10-24 MED ORDER — HYDROCODONE-ACETAMINOPHEN 5-325 MG PO TABS: 1.0000 | ORAL_TABLET | Freq: Four times a day (QID) | ORAL | 0 refills | Status: DC | PRN

## 2016-10-24 NOTE — Telephone Encounter (Signed)
RX ready for pick up.  Patient is aware.  Appointment made.

## 2016-10-24 NOTE — Telephone Encounter (Signed)
Refill OK and set up follow up to discuss (new opioid prescribing guidelines).

## 2016-10-24 NOTE — Telephone Encounter (Signed)
Last refill 08/27/16 and last office visit 01/23/16.  Okay to fill?

## 2016-11-19 ENCOUNTER — Encounter: Payer: Self-pay | Admitting: Family Medicine

## 2016-11-19 ENCOUNTER — Encounter: Payer: Self-pay | Admitting: *Deleted

## 2016-11-19 ENCOUNTER — Ambulatory Visit (INDEPENDENT_AMBULATORY_CARE_PROVIDER_SITE_OTHER): Payer: 59 | Admitting: General Practice

## 2016-11-19 ENCOUNTER — Ambulatory Visit (INDEPENDENT_AMBULATORY_CARE_PROVIDER_SITE_OTHER): Payer: 59 | Admitting: Family Medicine

## 2016-11-19 VITALS — BP 140/78 | HR 94 | Temp 98.1°F | Wt 181.2 lb

## 2016-11-19 DIAGNOSIS — R52 Pain, unspecified: Secondary | ICD-10-CM | POA: Diagnosis not present

## 2016-11-19 DIAGNOSIS — Z7901 Long term (current) use of anticoagulants: Secondary | ICD-10-CM

## 2016-11-19 DIAGNOSIS — M25552 Pain in left hip: Secondary | ICD-10-CM

## 2016-11-19 LAB — POCT INR: INR: 2.1

## 2016-11-19 MED ORDER — TRAMADOL HCL 50 MG PO TABS: 50.0000 mg | ORAL_TABLET | Freq: Two times a day (BID) | ORAL | 0 refills | Status: DC | PRN

## 2016-11-19 NOTE — Patient Instructions (Signed)
Pre visit review using our clinic review tool, if applicable. No additional management support is needed unless otherwise documented below in the visit note. 

## 2016-11-19 NOTE — Progress Notes (Signed)
Subjective:     Patient ID: Tyler Atkinson, male   DOB: December 21, 1955, 60 y.o.   MRN: [id number]  HPI Patient is here to discuss chronic pain management. He has history of some chronic left hip pain mostly laterally and also some left knee pain. No recent injury. Denies any lower lumbar back pain. He takes infrequent hydrocodone 5 mg usually about 30 tablets every 45 days when he states pain is severe. Has not gotten relief with regular Tylenol and he avoids nonsteroidals because of Coumadin. He has past history of DVT left upper extremity. He does not recall other medications such as tramadol.  Has taken sertraline in the past for depression that was years ago and he would like to consider discontinuing.  He has some pain with ambulation and especially after prolonged periods of sitting. Has never noted any visible swelling. No relief with ice.  Past Medical History:  Diagnosis Date  . Depression   . DVT of upper extremity (deep vein thrombosis) ([place]) 3/11  . Hay fever    allergies  . Hyperlipidemia   . Hypothyroidism   . Thyroid cancer [facility])    Past Surgical History:  Procedure Laterality Date  . COLONOSCOPY     01/27/2006. Normal  . THYROIDECTOMY     2000    reports that he quit smoking about 31 years ago. He has quit using smokeless tobacco. He reports that he drinks alcohol. He reports that he does not use drugs. family history includes Alzheimer's disease in his mother; Breast cancer in his unknown relative; Colon cancer in his unknown relative; Diabetes in his unknown relative; Heart disease in his unknown relative; Hypertension in his mother and unknown relative; Stroke in his unknown relative. Allergies  Allergen Reactions  . Ezetimibe-Simvastatin     REACTION: rash X 2-3 months     Review of Systems  Constitutional: Negative for fatigue.  Eyes: Negative for visual disturbance.  Respiratory: Negative for cough, chest tightness and shortness of breath.   Cardiovascular:  Negative for chest pain, palpitations and leg swelling.  Musculoskeletal: Positive for arthralgias.  Neurological: Negative for dizziness, syncope, weakness, light-headedness and headaches.       Objective:   Physical Exam  Constitutional: He is oriented to person, place, and time. He appears well-developed and well-nourished.  HENT:  Right Ear: External ear normal.  Left Ear: External ear normal.  Mouth/Throat: Oropharynx is clear and moist.  Eyes: Pupils are equal, round, and reactive to light.  Neck: Neck supple. No thyromegaly present.  Cardiovascular: Normal rate and regular rhythm.   Pulmonary/Chest: Effort normal and breath sounds normal. No respiratory distress. He has no wheezes. He has no rales.  Musculoskeletal: He exhibits no edema.  Tender left greater trochanteric bursa. Full range of motion hip. Left knee no effusion. No warmth. No erythema. Full range of motion. No localized tenderness.  Neurological: He is alert and oriented to person, place, and time.       Assessment:     #1 chronic pain related to left hip and left knee. He does have significant tenderness over the left greater trochanter bursa but good range of motion in the hip.  Doubt severe degenerative arthritis of hip.      Plan:     -We discussed new chronic pain management regulations. -Drug contract signed -Patient would like to explore other options such as tramadol. He will discontinue sertraline and we wrote prescription for tramadol 50 mg 1 every 12 hours as needed #60  with no refill and reassess 2 months -He understands that we will need to do once per year drug testing and every three-month follow-up if he remains on hydrocodone  Eulas Post MD [facility] at [facility]

## 2016-11-26 ENCOUNTER — Other Ambulatory Visit: Payer: Self-pay | Admitting: Family Medicine

## 2016-11-27 ENCOUNTER — Other Ambulatory Visit: Payer: Self-pay | Admitting: Family Medicine

## 2016-12-17 ENCOUNTER — Ambulatory Visit (INDEPENDENT_AMBULATORY_CARE_PROVIDER_SITE_OTHER): Payer: 59 | Admitting: General Practice

## 2016-12-17 DIAGNOSIS — Z23 Encounter for immunization: Secondary | ICD-10-CM

## 2016-12-17 DIAGNOSIS — Z7901 Long term (current) use of anticoagulants: Secondary | ICD-10-CM | POA: Diagnosis not present

## 2016-12-17 LAB — POCT INR: INR: 1.9

## 2016-12-17 NOTE — Patient Instructions (Addendum)
Pre visit review using our clinic review tool, if applicable. No additional management support is needed unless otherwise documented below in the visit note.  Take 1 tablet today and then change dosage and take 1/2 tablet daily except 1 tablet on Mondays.  Re-check in 4 weeks.

## 2016-12-29 ENCOUNTER — Telehealth: Payer: Self-pay | Admitting: Family Medicine

## 2016-12-29 NOTE — Telephone Encounter (Signed)
Request refill on Tramadol.

## 2016-12-29 NOTE — Telephone Encounter (Signed)
Take the hydrocodone off his med list.  OK to refill Tramadol with one additional refill.

## 2016-12-29 NOTE — Telephone Encounter (Signed)
Last refill was given at office visit 11/19/16.   Okay to fill?

## 2016-12-29 NOTE — Telephone Encounter (Signed)
Copied from [place] [id number]. Topic: Quick Communication - See Telephone Encounter >> Dec 29, 2016 11:36 AM Synthia Innocent wrote: CRM for notification. See Telephone encounter for:  Requesting refill on traMADol (ULTRAM) 50 MG tablet, [facility] [place] 12/29/16.

## 2016-12-30 ENCOUNTER — Other Ambulatory Visit: Payer: Self-pay | Admitting: *Deleted

## 2016-12-30 MED ORDER — TRAMADOL HCL 50 MG PO TABS: 50.0000 mg | ORAL_TABLET | Freq: Two times a day (BID) | ORAL | 1 refills | Status: DC | PRN

## 2016-12-30 MED ORDER — LEVOTHYROXINE SODIUM 88 MCG PO TABS: ORAL_TABLET | ORAL | 0 refills | Status: DC

## 2017-01-09 ENCOUNTER — Other Ambulatory Visit: Payer: Self-pay | Admitting: Family Medicine

## 2017-01-14 ENCOUNTER — Ambulatory Visit (INDEPENDENT_AMBULATORY_CARE_PROVIDER_SITE_OTHER): Payer: 59 | Admitting: General Practice

## 2017-01-14 DIAGNOSIS — Z7901 Long term (current) use of anticoagulants: Secondary | ICD-10-CM | POA: Diagnosis not present

## 2017-01-14 LAB — POCT INR: INR: 2.5

## 2017-01-14 NOTE — Patient Instructions (Addendum)
Pre visit review using our clinic review tool, if applicable. No additional management support is needed unless otherwise documented below in the visit note.  Continue to take 1/2 tablet daily except 1 tablet on Mondays.  Re-check in 4 weeks.  

## 2017-01-30 ENCOUNTER — Ambulatory Visit (INDEPENDENT_AMBULATORY_CARE_PROVIDER_SITE_OTHER): Payer: 59 | Admitting: Family Medicine

## 2017-01-30 ENCOUNTER — Encounter: Payer: Self-pay | Admitting: Family Medicine

## 2017-01-30 VITALS — BP 130/80 | HR 78 | Temp 98.3°F | Ht 70.0 in | Wt 173.0 lb

## 2017-01-30 DIAGNOSIS — Z0000 Encounter for general adult medical examination without abnormal findings: Secondary | ICD-10-CM | POA: Diagnosis not present

## 2017-01-30 LAB — CBC WITH DIFFERENTIAL/PLATELET
Basophils Absolute: 0.1 K/uL (ref 0.0–0.1)
Basophils Relative: 1 % (ref 0.0–3.0)
Eosinophils Absolute: 0.2 K/uL (ref 0.0–0.7)
Eosinophils Relative: 3.4 % (ref 0.0–5.0)
HCT: 44.6 % (ref 39.0–52.0)
Hemoglobin: 14.9 g/dL (ref 13.0–17.0)
Lymphocytes Relative: 29.7 % (ref 12.0–46.0)
Lymphs Abs: 1.8 K/uL (ref 0.7–4.0)
MCHC: 33.4 g/dL (ref 30.0–36.0)
MCV: 96.7 fl (ref 78.0–100.0)
Monocytes Absolute: 0.8 K/uL (ref 0.1–1.0)
Monocytes Relative: 12.4 % — ABNORMAL HIGH (ref 3.0–12.0)
Neutro Abs: 3.3 K/uL (ref 1.4–7.7)
Neutrophils Relative %: 53.5 % (ref 43.0–77.0)
Platelets: 297 K/uL (ref 150.0–400.0)
RBC: 4.61 Mil/uL (ref 4.22–5.81)
RDW: 12.2 % (ref 11.5–15.5)
WBC: 6.2 K/uL (ref 4.0–10.5)

## 2017-01-30 LAB — LIPID PANEL
Cholesterol: 191 mg/dL (ref 0–200)
HDL: 60.1 mg/dL
LDL Cholesterol: 105 mg/dL — ABNORMAL HIGH (ref 0–99)
NonHDL: 131.12
Total CHOL/HDL Ratio: 3
Triglycerides: 129 mg/dL (ref 0.0–149.0)
VLDL: 25.8 mg/dL (ref 0.0–40.0)

## 2017-01-30 LAB — HEPATIC FUNCTION PANEL
ALT: 19 U/L (ref 0–53)
AST: 17 U/L (ref 0–37)
Albumin: 4.8 g/dL (ref 3.5–5.2)
Alkaline Phosphatase: 71 U/L (ref 39–117)
Bilirubin, Direct: 0.2 mg/dL (ref 0.0–0.3)
Total Bilirubin: 1 mg/dL (ref 0.2–1.2)
Total Protein: 7.2 g/dL (ref 6.0–8.3)

## 2017-01-30 LAB — BASIC METABOLIC PANEL WITH GFR
BUN: 11 mg/dL (ref 6–23)
CO2: 30 meq/L (ref 19–32)
Calcium: 9.5 mg/dL (ref 8.4–10.5)
Chloride: 103 meq/L (ref 96–112)
Creatinine, Ser: 0.74 mg/dL (ref 0.40–1.50)
GFR: 113.49 mL/min
Glucose, Bld: 89 mg/dL (ref 70–99)
Potassium: 4.4 meq/L (ref 3.5–5.1)
Sodium: 141 meq/L (ref 135–145)

## 2017-01-30 LAB — PSA: PSA: 0.98 ng/mL (ref 0.10–4.00)

## 2017-01-30 LAB — TSH: TSH: <0.01 u[IU]/mL — ABNORMAL LOW (ref 0.35–4.50)

## 2017-01-30 MED ORDER — WARFARIN SODIUM 5 MG PO TABS: ORAL_TABLET | ORAL | 2 refills | Status: DC

## 2017-01-30 MED ORDER — SERTRALINE HCL 50 MG PO TABS: 50.0000 mg | ORAL_TABLET | Freq: Every day | ORAL | 3 refills | Status: DC

## 2017-01-30 NOTE — Progress Notes (Signed)
Subjective:     Patient ID: Tyler Atkinson, male   DOB: [birth year], 62 y.o.   MRN: [id number]  HPI Patient here for physical exam.  He has history of DVT upper extremity, hypothyroidism, guttate psoriasis, hyperlipidemia, recurrent depression, and some chronic back and chronic hip pain.  Hip pain is treated with tramadol currently and working fairly well. He is due for repeat colonoscopy. No history of hepatitis C screening. Low risk.  Tried tapering off sertraline during past year but his mom was just diagnosed with alzheimer's disease and he has had some recurrent depression symptoms and went back on this recently.  Past Medical History:  Diagnosis Date  . Depression   . DVT of upper extremity (deep vein thrombosis) ([place]) 3/11  . Hay fever    allergies  . Hyperlipidemia   . Hypothyroidism   . Thyroid cancer [facility])    Past Surgical History:  Procedure Laterality Date  . COLONOSCOPY     01/27/2006. Normal  . THYROIDECTOMY     2000    reports that he quit smoking about 32 years ago. He has quit using smokeless tobacco. He reports that he drinks alcohol. He reports that he does not use drugs. family history includes Alzheimer's disease in his mother; Breast cancer in his unknown relative; Colon cancer in his unknown relative; Diabetes in his unknown relative; Heart disease in his unknown relative; Hypertension in his mother and unknown relative; Stroke in his unknown relative. Allergies  Allergen Reactions  . Ezetimibe-Simvastatin     REACTION: rash X 2-3 months     Review of Systems  Constitutional: Negative for activity change, appetite change, fatigue and fever.  HENT: Negative for congestion, ear pain and trouble swallowing.   Eyes: Negative for pain and visual disturbance.  Respiratory: Negative for cough, shortness of breath and wheezing.   Cardiovascular: Negative for chest pain and palpitations.  Gastrointestinal: Negative for abdominal distention, abdominal pain, blood  in stool, constipation, diarrhea, nausea, rectal pain and vomiting.  Genitourinary: Negative for dysuria, hematuria and testicular pain.  Musculoskeletal: Negative for arthralgias and joint swelling.  Skin: Negative for rash.  Neurological: Negative for dizziness, syncope and headaches.  Hematological: Negative for adenopathy.  Psychiatric/Behavioral: Positive for dysphoric mood. Negative for confusion and suicidal ideas.       Objective:   Physical Exam  Constitutional: He is oriented to person, place, and time. He appears well-developed and well-nourished. No distress.  HENT:  Head: Normocephalic and atraumatic.  Right Ear: External ear normal.  Left Ear: External ear normal.  Mouth/Throat: Oropharynx is clear and moist.  Eyes: Conjunctivae and EOM are normal. Pupils are equal, round, and reactive to light.  Neck: Normal range of motion. Neck supple. No thyromegaly present.  Cardiovascular: Normal rate, regular rhythm and normal heart sounds.  No murmur heard. Pulmonary/Chest: No respiratory distress. He has no wheezes. He has no rales.  Abdominal: Soft. Bowel sounds are normal. He exhibits no distension and no mass. There is no tenderness. There is no rebound and no guarding.  Musculoskeletal: He exhibits no edema.  Lymphadenopathy:    He has no cervical adenopathy.  Neurological: He is alert and oriented to person, place, and time. He displays normal reflexes. No cranial nerve deficit.  Skin: No rash noted.  Psychiatric: He has a normal mood and affect.       Assessment:     Physical exam. The following issues were addressed    Plan:     -Set up repeat  colonoscopy. He will need to be bridged with Lovenox when he gets procedure done -Check labs including hepatitis C antibody -Refilled his Coumadin and sertraline  Eulas Post MD [facility]

## 2017-01-31 LAB — HEPATITIS C ANTIBODY
Hepatitis C Ab: NONREACTIVE
SIGNAL TO CUT-OFF: 0.01 (ref <1.00)

## 2017-02-03 ENCOUNTER — Other Ambulatory Visit: Payer: Self-pay | Admitting: Family Medicine

## 2017-02-03 DIAGNOSIS — E039 Hypothyroidism, unspecified: Secondary | ICD-10-CM

## 2017-02-03 MED ORDER — LEVOTHYROXINE SODIUM 75 MCG PO TABS: 75.0000 ug | ORAL_TABLET | Freq: Every day | ORAL | 3 refills | Status: DC

## 2017-02-18 ENCOUNTER — Ambulatory Visit (INDEPENDENT_AMBULATORY_CARE_PROVIDER_SITE_OTHER): Payer: 59 | Admitting: General Practice

## 2017-02-18 DIAGNOSIS — Z7901 Long term (current) use of anticoagulants: Secondary | ICD-10-CM | POA: Diagnosis not present

## 2017-02-18 LAB — POCT INR: INR: 4

## 2017-02-18 NOTE — Patient Instructions (Addendum)
Pre visit review using our clinic review tool, if applicable. No additional management support is needed unless otherwise documented below in the visit note.  Skip coumadin tomorrow and then take 1/2 tablet daily.  Re-check in 3 to 4 weeks.

## 2017-03-17 ENCOUNTER — Other Ambulatory Visit: Payer: Self-pay | Admitting: Family Medicine

## 2017-03-18 ENCOUNTER — Ambulatory Visit (INDEPENDENT_AMBULATORY_CARE_PROVIDER_SITE_OTHER): Payer: 59 | Admitting: General Practice

## 2017-03-18 DIAGNOSIS — Z7901 Long term (current) use of anticoagulants: Secondary | ICD-10-CM

## 2017-03-18 LAB — POCT INR: INR: 2

## 2017-03-18 NOTE — Patient Instructions (Addendum)
Pre visit review using our clinic review tool, if applicable. No additional management support is needed unless otherwise documented below in the visit note.  Continue to take 1/2 tablet daily.  Re-check in 3 to 4 weeks.

## 2017-03-18 NOTE — Telephone Encounter (Signed)
Last refill 12/30/16 and last office visit 01/30/17.  Okay to fill?

## 2017-03-18 NOTE — Telephone Encounter (Signed)
Refill OK

## 2017-03-30 ENCOUNTER — Other Ambulatory Visit: Payer: Self-pay | Admitting: Family Medicine

## 2017-03-30 ENCOUNTER — Encounter: Payer: Self-pay | Admitting: Family Medicine

## 2017-03-31 NOTE — Telephone Encounter (Signed)
Last refill 03/18/17 and last office visit 01/30/17.  Okay to fill?

## 2017-04-01 NOTE — Telephone Encounter (Signed)
Too soon if just filled on 03-18-17.

## 2017-04-02 NOTE — Telephone Encounter (Signed)
Rx denial sent to the pharmacy. 

## 2017-04-15 ENCOUNTER — Ambulatory Visit (INDEPENDENT_AMBULATORY_CARE_PROVIDER_SITE_OTHER): Payer: 59 | Admitting: General Practice

## 2017-04-15 ENCOUNTER — Other Ambulatory Visit (INDEPENDENT_AMBULATORY_CARE_PROVIDER_SITE_OTHER): Payer: 59

## 2017-04-15 DIAGNOSIS — Z7901 Long term (current) use of anticoagulants: Secondary | ICD-10-CM

## 2017-04-15 DIAGNOSIS — E039 Hypothyroidism, unspecified: Secondary | ICD-10-CM | POA: Diagnosis not present

## 2017-04-15 LAB — POCT INR: INR: 2.1

## 2017-04-15 NOTE — Patient Instructions (Addendum)
Pre visit review using our clinic review tool, if applicable. No additional management support is needed unless otherwise documented below in the visit note.  Continue to take 1/2 tablet daily.  Re-check in 4 weeks.  

## 2017-04-16 LAB — TSH: TSH: <0.01 u[IU]/mL — ABNORMAL LOW (ref 0.35–4.50)

## 2017-04-17 ENCOUNTER — Other Ambulatory Visit: Payer: Self-pay | Admitting: Family Medicine

## 2017-04-17 DIAGNOSIS — E039 Hypothyroidism, unspecified: Secondary | ICD-10-CM

## 2017-04-17 MED ORDER — LEVOTHYROXINE SODIUM 50 MCG PO TABS: 50.0000 ug | ORAL_TABLET | Freq: Every day | ORAL | 0 refills | Status: DC

## 2017-05-11 ENCOUNTER — Ambulatory Visit: Payer: 59

## 2017-05-13 ENCOUNTER — Ambulatory Visit (INDEPENDENT_AMBULATORY_CARE_PROVIDER_SITE_OTHER): Payer: 59 | Admitting: General Practice

## 2017-05-13 ENCOUNTER — Other Ambulatory Visit: Payer: Self-pay | Admitting: Family Medicine

## 2017-05-13 DIAGNOSIS — Z7901 Long term (current) use of anticoagulants: Secondary | ICD-10-CM | POA: Diagnosis not present

## 2017-05-13 LAB — POCT INR: INR: 2.1

## 2017-05-13 NOTE — Telephone Encounter (Signed)
Last refill 03/18/17.  Last CPE and office visit 01/30/17.  Okay to fill?

## 2017-05-13 NOTE — Patient Instructions (Addendum)
Pre visit review using our clinic review tool, if applicable. No additional management support is needed unless otherwise documented below in the visit note.  Continue to take 1/2 tablet daily.  Re-check in 4 weeks.  

## 2017-05-15 NOTE — Telephone Encounter (Signed)
Refill OK

## 2017-06-10 ENCOUNTER — Ambulatory Visit (INDEPENDENT_AMBULATORY_CARE_PROVIDER_SITE_OTHER): Payer: 59 | Admitting: General Practice

## 2017-06-10 ENCOUNTER — Other Ambulatory Visit (INDEPENDENT_AMBULATORY_CARE_PROVIDER_SITE_OTHER): Payer: 59

## 2017-06-10 DIAGNOSIS — Z7901 Long term (current) use of anticoagulants: Secondary | ICD-10-CM | POA: Diagnosis not present

## 2017-06-10 DIAGNOSIS — Z5181 Encounter for therapeutic drug level monitoring: Secondary | ICD-10-CM | POA: Diagnosis not present

## 2017-06-10 DIAGNOSIS — E039 Hypothyroidism, unspecified: Secondary | ICD-10-CM

## 2017-06-10 LAB — POCT INR: INR: 2

## 2017-06-10 LAB — TSH: TSH: 0.11 u[IU]/mL — ABNORMAL LOW (ref 0.35–4.50)

## 2017-06-10 NOTE — Patient Instructions (Signed)
Pre visit review using our clinic review tool, if applicable. No additional management support is needed unless otherwise documented below in the visit note.  Continue to take 1/2 tablet daily.  Re-check in 4 weeks.  

## 2017-06-12 ENCOUNTER — Other Ambulatory Visit: Payer: Self-pay | Admitting: Family Medicine

## 2017-06-12 DIAGNOSIS — E039 Hypothyroidism, unspecified: Secondary | ICD-10-CM

## 2017-06-15 ENCOUNTER — Telehealth: Payer: Self-pay | Admitting: Family Medicine

## 2017-06-15 NOTE — Telephone Encounter (Signed)
Returned call left message to call back, for lab results.

## 2017-06-15 NOTE — Telephone Encounter (Signed)
Copied from [place] [id number]. Topic: Quick Communication - Lab Results >> Jun 12, 2017 11:57 AM Lamarr Lulas, CMA wrote: Called patient to inform them of  lab results. When patient returns call, triage nurse may disclose results.   Pt returning call about lab results.

## 2017-06-15 NOTE — Telephone Encounter (Signed)
See result notes, charted there.

## 2017-06-15 NOTE — Telephone Encounter (Signed)
Copied from [place] [id number]. Topic: Quick Communication - Lab Results >> Jun 12, 2017 11:57 AM Lamarr Lulas, CMA wrote: Called patient to inform them of  lab results. When patient returns call, triage nurse may disclose results.  Pt calling back for lab results

## 2017-06-15 NOTE — Telephone Encounter (Signed)
Charted in result notes. 

## 2017-06-16 ENCOUNTER — Other Ambulatory Visit: Payer: Self-pay | Admitting: *Deleted

## 2017-06-16 ENCOUNTER — Other Ambulatory Visit: Payer: Self-pay | Admitting: Family Medicine

## 2017-06-16 DIAGNOSIS — E039 Hypothyroidism, unspecified: Secondary | ICD-10-CM

## 2017-06-16 MED ORDER — LEVOTHYROXINE SODIUM 25 MCG PO TABS: 25.0000 ug | ORAL_TABLET | Freq: Every day | ORAL | 1 refills | Status: DC

## 2017-06-23 ENCOUNTER — Other Ambulatory Visit: Payer: Self-pay | Admitting: Family Medicine

## 2017-06-24 NOTE — Telephone Encounter (Signed)
Refill with 5 additional refills. 

## 2017-06-24 NOTE — Telephone Encounter (Signed)
Last refill 05/18/17 and last office visit 01/30/17.  Okay to fill?

## 2017-07-11 ENCOUNTER — Other Ambulatory Visit: Payer: Self-pay | Admitting: Family Medicine

## 2017-07-15 ENCOUNTER — Ambulatory Visit (INDEPENDENT_AMBULATORY_CARE_PROVIDER_SITE_OTHER): Payer: 59 | Admitting: General Practice

## 2017-07-15 DIAGNOSIS — Z7901 Long term (current) use of anticoagulants: Secondary | ICD-10-CM | POA: Diagnosis not present

## 2017-07-15 LAB — POCT INR: INR: 2.1 (ref 2.0–3.0)

## 2017-07-15 NOTE — Patient Instructions (Addendum)
Pre visit review using our clinic review tool, if applicable. No additional management support is needed unless otherwise documented below in the visit note.  Continue to take 1/2 tablet daily.  Re-check in 6 weeks.

## 2017-07-21 ENCOUNTER — Other Ambulatory Visit: Payer: Self-pay | Admitting: Family Medicine

## 2017-08-26 ENCOUNTER — Ambulatory Visit (INDEPENDENT_AMBULATORY_CARE_PROVIDER_SITE_OTHER): Payer: 59 | Admitting: General Practice

## 2017-08-26 DIAGNOSIS — Z7901 Long term (current) use of anticoagulants: Secondary | ICD-10-CM | POA: Diagnosis not present

## 2017-08-26 LAB — POCT INR: INR: 1.7 — AB (ref 2.0–3.0)

## 2017-08-26 NOTE — Patient Instructions (Addendum)
Pre visit review using our clinic review tool, if applicable. No additional management support is needed unless otherwise documented below in the visit note.  Take 1 tablet today and then take  take 1/2 tablet daily except 1 tablet on Wednesdays.  Re-check in 6 weeks.

## 2017-10-07 ENCOUNTER — Ambulatory Visit (INDEPENDENT_AMBULATORY_CARE_PROVIDER_SITE_OTHER): Payer: 59 | Admitting: General Practice

## 2017-10-07 ENCOUNTER — Other Ambulatory Visit (INDEPENDENT_AMBULATORY_CARE_PROVIDER_SITE_OTHER): Payer: 59

## 2017-10-07 DIAGNOSIS — E039 Hypothyroidism, unspecified: Secondary | ICD-10-CM | POA: Diagnosis not present

## 2017-10-07 DIAGNOSIS — Z7901 Long term (current) use of anticoagulants: Secondary | ICD-10-CM | POA: Diagnosis not present

## 2017-10-07 LAB — T3, FREE: T3 FREE: 5 pg/mL — AB (ref 2.3–4.2)

## 2017-10-07 LAB — POCT INR: INR: 2.4 (ref 2.0–3.0)

## 2017-10-07 LAB — T4, FREE: FREE T4: 0.32 ng/dL — AB (ref 0.60–1.60)

## 2017-10-07 LAB — TSH: TSH: 7.22 u[IU]/mL — ABNORMAL HIGH (ref 0.35–4.50)

## 2017-10-07 NOTE — Patient Instructions (Addendum)
Pre visit review using our clinic review tool, if applicable. No additional management support is needed unless otherwise documented below in the visit note.  Continue to take  take 1/2 tablet daily except 1 tablet on Wednesdays.  Re-check in 6 weeks. 

## 2017-10-12 ENCOUNTER — Other Ambulatory Visit: Payer: Self-pay

## 2017-10-12 DIAGNOSIS — R7989 Other specified abnormal findings of blood chemistry: Secondary | ICD-10-CM

## 2017-11-18 ENCOUNTER — Ambulatory Visit (INDEPENDENT_AMBULATORY_CARE_PROVIDER_SITE_OTHER): Payer: 59 | Admitting: General Practice

## 2017-11-18 DIAGNOSIS — Z7901 Long term (current) use of anticoagulants: Secondary | ICD-10-CM | POA: Diagnosis not present

## 2017-11-18 DIAGNOSIS — Z23 Encounter for immunization: Secondary | ICD-10-CM | POA: Diagnosis not present

## 2017-11-18 LAB — POCT INR: INR: 2.3 (ref 2.0–3.0)

## 2017-11-18 NOTE — Patient Instructions (Addendum)
Pre visit review using our clinic review tool, if applicable. No additional management support is needed unless otherwise documented below in the visit note.  Continue to take  take 1/2 tablet daily except 1 tablet on Wednesdays.  Re-check in 6 weeks. 

## 2017-12-19 ENCOUNTER — Other Ambulatory Visit: Payer: Self-pay | Admitting: Family Medicine

## 2017-12-22 ENCOUNTER — Other Ambulatory Visit: Payer: Self-pay | Admitting: Family Medicine

## 2017-12-30 ENCOUNTER — Ambulatory Visit: Payer: 59 | Admitting: General Practice

## 2017-12-30 DIAGNOSIS — Z7901 Long term (current) use of anticoagulants: Secondary | ICD-10-CM

## 2017-12-30 LAB — POCT INR: INR: 2.8 (ref 2.0–3.0)

## 2017-12-30 NOTE — Patient Instructions (Signed)
Pre visit review using our clinic review tool, if applicable. No additional management support is needed unless otherwise documented below in the visit note.  Continue to take  take 1/2 tablet daily except 1 tablet on Wednesdays.  Re-check in 6 weeks. 

## 2018-02-02 ENCOUNTER — Ambulatory Visit (INDEPENDENT_AMBULATORY_CARE_PROVIDER_SITE_OTHER): Payer: 59 | Admitting: General Practice

## 2018-02-02 ENCOUNTER — Ambulatory Visit (INDEPENDENT_AMBULATORY_CARE_PROVIDER_SITE_OTHER): Payer: 59 | Admitting: Family Medicine

## 2018-02-02 ENCOUNTER — Encounter: Payer: Self-pay | Admitting: Family Medicine

## 2018-02-02 ENCOUNTER — Other Ambulatory Visit: Payer: Self-pay

## 2018-02-02 VITALS — BP 142/90 | HR 75 | Temp 98.3°F | Ht 67.25 in | Wt 175.8 lb

## 2018-02-02 DIAGNOSIS — Z7901 Long term (current) use of anticoagulants: Secondary | ICD-10-CM | POA: Diagnosis not present

## 2018-02-02 DIAGNOSIS — E039 Hypothyroidism, unspecified: Secondary | ICD-10-CM

## 2018-02-02 DIAGNOSIS — Z0000 Encounter for general adult medical examination without abnormal findings: Secondary | ICD-10-CM | POA: Diagnosis not present

## 2018-02-02 LAB — CBC WITH DIFFERENTIAL/PLATELET
BASOS PCT: 0.9 % (ref 0.0–3.0)
Basophils Absolute: 0.1 10*3/uL (ref 0.0–0.1)
EOS PCT: 3.8 % (ref 0.0–5.0)
Eosinophils Absolute: 0.2 10*3/uL (ref 0.0–0.7)
HEMATOCRIT: 43.7 % (ref 39.0–52.0)
HEMOGLOBIN: 14.8 g/dL (ref 13.0–17.0)
LYMPHS PCT: 30.9 % (ref 12.0–46.0)
Lymphs Abs: 1.9 10*3/uL (ref 0.7–4.0)
MCHC: 33.9 g/dL (ref 30.0–36.0)
MCV: 96.2 fl (ref 78.0–100.0)
MONOS PCT: 11.9 % (ref 3.0–12.0)
Monocytes Absolute: 0.7 10*3/uL (ref 0.1–1.0)
Neutro Abs: 3.2 10*3/uL (ref 1.4–7.7)
Neutrophils Relative %: 52.5 % (ref 43.0–77.0)
Platelets: 280 10*3/uL (ref 150.0–400.0)
RBC: 4.55 Mil/uL (ref 4.22–5.81)
RDW: 12.6 % (ref 11.5–15.5)
WBC: 6.1 10*3/uL (ref 4.0–10.5)

## 2018-02-02 LAB — HEPATIC FUNCTION PANEL
ALK PHOS: 66 U/L (ref 39–117)
ALT: 18 U/L (ref 0–53)
AST: 18 U/L (ref 0–37)
Albumin: 4.7 g/dL (ref 3.5–5.2)
Bilirubin, Direct: 0.1 mg/dL (ref 0.0–0.3)
Total Bilirubin: 0.6 mg/dL (ref 0.2–1.2)
Total Protein: 7 g/dL (ref 6.0–8.3)

## 2018-02-02 LAB — BASIC METABOLIC PANEL
BUN: 14 mg/dL (ref 6–23)
CALCIUM: 9.7 mg/dL (ref 8.4–10.5)
CO2: 30 mEq/L (ref 19–32)
CREATININE: 0.85 mg/dL (ref 0.40–1.50)
Chloride: 103 mEq/L (ref 96–112)
GFR: 96.41 mL/min (ref >60.00)
Glucose, Bld: 94 mg/dL (ref 70–99)
POTASSIUM: 4.5 meq/L (ref 3.5–5.1)
Sodium: 140 mEq/L (ref 135–145)

## 2018-02-02 LAB — LIPID PANEL
CHOLESTEROL: 234 mg/dL — AB (ref 0–200)
HDL: 68.9 mg/dL (ref >39.00)
LDL Cholesterol: 146 mg/dL — ABNORMAL HIGH (ref 0–99)
NONHDL: 165.05
Total CHOL/HDL Ratio: 3
Triglycerides: 96 mg/dL (ref 0.0–149.0)
VLDL: 19.2 mg/dL (ref 0.0–40.0)

## 2018-02-02 LAB — POCT INR: INR: 1.9 — AB (ref 2.0–3.0)

## 2018-02-02 LAB — T4, FREE: Free T4: 0.49 ng/dL — ABNORMAL LOW (ref 0.60–1.60)

## 2018-02-02 LAB — PSA: PSA: 0.59 ng/mL (ref 0.10–4.00)

## 2018-02-02 LAB — TSH: TSH: 4.92 u[IU]/mL — AB (ref 0.35–4.50)

## 2018-02-02 NOTE — Addendum Note (Signed)
Addended by: Elmer Picker on: 02/02/2018 09:48 AM   Modules accepted: Orders

## 2018-02-02 NOTE — Progress Notes (Signed)
Subjective:     Patient ID: Tyler Atkinson, male   DOB: Jun 14, 1955, 63 y.o.   MRN: [id number]  HPI Patient is here for physical exam.  We had referred him for a colonoscopy which is overdue last year but he had conflicts with scheduling and apparently never rescheduled.  He would like to go ahead and get repeat colonoscopy done.  Last colonoscopy reportedly 2008.  His chronic problems include history of chronic Coumadin use secondary to DVT history, history of gout Tate psoriasis, hyperlipidemia, history of recurrent depression  No consistent exercise.  Patient's had flu vaccine.  He had previous shingles vaccine.  Hepatitis C screen last year negative.  He has hypothyroidism and we have had some issues with getting him regulated during the past year.  Compliant with medications.  Quit smoking around 1987.  Past Medical History:  Diagnosis Date  . Depression   . DVT of upper extremity (deep vein thrombosis) ([place]) 3/11  . Hay fever    allergies  . Hyperlipidemia   . Hypothyroidism   . Thyroid cancer [facility])    Past Surgical History:  Procedure Laterality Date  . COLONOSCOPY     01/27/2006. Normal  . THYROIDECTOMY     2000    reports that he quit smoking about 33 years ago. He has quit using smokeless tobacco. He reports current alcohol use. He reports that he does not use drugs. family history includes Alzheimer's disease in his mother; Breast cancer in an other family member; Colon cancer in an other family member; Diabetes in an other family member; Heart disease in an other family member; Hypertension in his mother and another family member; Stroke in an other family member. Allergies  Allergen Reactions  . Ezetimibe-Simvastatin     REACTION: rash X 2-3 months     Review of Systems  Constitutional: Negative for activity change, appetite change, fatigue, fever and unexpected weight change.  HENT: Negative for congestion, ear pain and trouble swallowing.   Eyes: Negative for  pain and visual disturbance.  Respiratory: Negative for cough, shortness of breath and wheezing.   Cardiovascular: Negative for chest pain and palpitations.  Gastrointestinal: Negative for abdominal distention, abdominal pain, blood in stool, constipation, diarrhea, nausea, rectal pain and vomiting.  Genitourinary: Negative for dysuria, hematuria and testicular pain.  Musculoskeletal: Positive for arthralgias and back pain. Negative for joint swelling.  Skin: Negative for rash.  Neurological: Negative for dizziness, syncope and headaches.  Hematological: Negative for adenopathy.  Psychiatric/Behavioral: Negative for confusion and dysphoric mood.       Objective:   Physical Exam Constitutional:      General: He is not in acute distress.    Appearance: He is well-developed.  HENT:     Head: Normocephalic and atraumatic.     Right Ear: External ear normal.     Left Ear: External ear normal.  Eyes:     Conjunctiva/sclera: Conjunctivae normal.     Pupils: Pupils are equal, round, and reactive to light.  Neck:     Musculoskeletal: Normal range of motion and neck supple.     Thyroid: No thyromegaly.  Cardiovascular:     Rate and Rhythm: Normal rate and regular rhythm.     Heart sounds: Normal heart sounds. No murmur.  Pulmonary:     Effort: No respiratory distress.     Breath sounds: No wheezing or rales.  Abdominal:     General: Bowel sounds are normal. There is no distension.     Palpations:  Abdomen is soft. There is no mass.     Tenderness: There is no abdominal tenderness. There is no guarding or rebound.  Lymphadenopathy:     Cervical: No cervical adenopathy.  Skin:    Findings: No rash.  Neurological:     Mental Status: He is alert and oriented to person, place, and time.     Cranial Nerves: No cranial nerve deficit.     Deep Tendon Reflexes: Reflexes normal.        Assessment:     Physical exam.  The following health maintenance issues were addressed today     Plan:     -Repeat colonoscopy -Discussed Shingrix vaccine and he will check on insurance coverage -Establish more consistent exercise -Obtain screening labs -The natural history of prostate cancer and ongoing controversy regarding screening and potential treatment outcomes of prostate cancer has been discussed with the patient. The meaning of a false positive PSA and a false negative PSA has been discussed. He indicates understanding of the limitations of this screening test and wishes to proceed with screening PSA testing.  Eulas Post MD [facility] at [facility]

## 2018-02-02 NOTE — Patient Instructions (Signed)
Pre visit review using our clinic review tool, if applicable. No additional management support is needed unless otherwise documented below in the visit note.  NR resulted at [facility] and results called to Villa Herb, RN.  Take 1 tablet today (1/7) and then continue to take  take 1/2 tablet daily except 1 tablet on Wednesdays.  Re-check in 6 weeks.

## 2018-02-02 NOTE — Patient Instructions (Signed)
We will set up referral for colonoscopy  Consider new shingles vaccine- Shingrix and check on coverage if interested.

## 2018-02-09 ENCOUNTER — Other Ambulatory Visit: Payer: Self-pay | Admitting: Family Medicine

## 2018-02-09 NOTE — Telephone Encounter (Signed)
Refill with 5 additional refills.

## 2018-02-09 NOTE — Telephone Encounter (Signed)
Last OV 02/02/18, No future OV  Last filled 06/24/17, # 60 with 5 refills

## 2018-02-10 ENCOUNTER — Ambulatory Visit: Payer: 59

## 2018-03-15 ENCOUNTER — Encounter: Payer: Self-pay | Admitting: Family Medicine

## 2018-03-16 ENCOUNTER — Ambulatory Visit (INDEPENDENT_AMBULATORY_CARE_PROVIDER_SITE_OTHER): Payer: 59 | Admitting: General Practice

## 2018-03-16 ENCOUNTER — Telehealth: Payer: Self-pay | Admitting: Family Medicine

## 2018-03-16 DIAGNOSIS — E039 Hypothyroidism, unspecified: Secondary | ICD-10-CM

## 2018-03-16 DIAGNOSIS — Z7901 Long term (current) use of anticoagulants: Secondary | ICD-10-CM | POA: Diagnosis not present

## 2018-03-16 LAB — POCT INR: INR: 2.4 (ref 2.0–3.0)

## 2018-03-16 NOTE — Telephone Encounter (Signed)
-----   Message from Warden Fillers, RN sent at 03/16/2018  1:24 PM EST ----- Regarding: TSH Dr. Elease Hashimoto,  Patient says he needs to have a 90 day check for TSH.  Could you put in a future lab order for that.  I will be seeing him for INR on 4/1.  Thanks! Villa Herb, RN

## 2018-03-16 NOTE — Patient Instructions (Addendum)
Pre visit review using our clinic review tool, if applicable. No additional management support is needed unless otherwise documented below in the visit note.  Continue to take  take 1/2 tablet daily except 1 tablet on Wednesdays.  Re-check in 6 weeks. 

## 2018-03-27 ENCOUNTER — Other Ambulatory Visit: Payer: Self-pay | Admitting: Family Medicine

## 2018-03-29 ENCOUNTER — Other Ambulatory Visit: Payer: Self-pay | Admitting: Family Medicine

## 2018-04-28 ENCOUNTER — Other Ambulatory Visit: Payer: Self-pay

## 2018-04-28 ENCOUNTER — Other Ambulatory Visit (INDEPENDENT_AMBULATORY_CARE_PROVIDER_SITE_OTHER): Payer: 59

## 2018-04-28 ENCOUNTER — Ambulatory Visit (INDEPENDENT_AMBULATORY_CARE_PROVIDER_SITE_OTHER): Payer: 59 | Admitting: General Practice

## 2018-04-28 DIAGNOSIS — E039 Hypothyroidism, unspecified: Secondary | ICD-10-CM | POA: Diagnosis not present

## 2018-04-28 DIAGNOSIS — Z7901 Long term (current) use of anticoagulants: Secondary | ICD-10-CM | POA: Diagnosis not present

## 2018-04-28 LAB — TSH: TSH: 0.21 u[IU]/mL — ABNORMAL LOW (ref 0.35–4.50)

## 2018-04-28 LAB — POCT INR: INR: 3 (ref 2.0–3.0)

## 2018-04-28 NOTE — Patient Instructions (Signed)
Pre visit review using our clinic review tool, if applicable. No additional management support is needed unless otherwise documented below in the visit note.  Continue to take  take 1/2 tablet daily except 1 tablet on Wednesdays.  Re-check in 6 weeks.

## 2018-05-12 ENCOUNTER — Other Ambulatory Visit: Payer: Self-pay | Admitting: Family Medicine

## 2018-06-09 ENCOUNTER — Ambulatory Visit (INDEPENDENT_AMBULATORY_CARE_PROVIDER_SITE_OTHER): Payer: 59 | Admitting: Family Medicine

## 2018-06-09 ENCOUNTER — Ambulatory Visit (INDEPENDENT_AMBULATORY_CARE_PROVIDER_SITE_OTHER): Payer: 59 | Admitting: General Practice

## 2018-06-09 ENCOUNTER — Other Ambulatory Visit: Payer: Self-pay | Admitting: Family Medicine

## 2018-06-09 ENCOUNTER — Other Ambulatory Visit: Payer: Self-pay

## 2018-06-09 ENCOUNTER — Ambulatory Visit (INDEPENDENT_AMBULATORY_CARE_PROVIDER_SITE_OTHER): Payer: 59

## 2018-06-09 ENCOUNTER — Encounter: Payer: Self-pay | Admitting: Family Medicine

## 2018-06-09 VITALS — BP 154/90 | HR 85 | Temp 98.0°F | Ht 69.0 in | Wt 178.2 lb

## 2018-06-09 DIAGNOSIS — Z7901 Long term (current) use of anticoagulants: Secondary | ICD-10-CM

## 2018-06-09 DIAGNOSIS — M25562 Pain in left knee: Secondary | ICD-10-CM

## 2018-06-09 DIAGNOSIS — R7989 Other specified abnormal findings of blood chemistry: Secondary | ICD-10-CM

## 2018-06-09 LAB — POCT INR: INR: 2.2 (ref 2.0–3.0)

## 2018-06-09 NOTE — Progress Notes (Signed)
  Subjective:     Patient ID: Tyler Atkinson, male   DOB: 01-10-1956, 63 y.o.   MRN: [id number]  HPI  Patient is seen in office with left knee pain for approximately 1 year.  He states he had some progressive knee pain somewhat poorly localized.  No known injury.  Has never noted any redness, ecchymosis, or warmth.  No locking or giving way.  He has some stiffness especially after prolonged periods of sitting.  Never had any x-rays. He thinks he may have reaggravated the knee on several occasions after doing some yard work  Chronic problems include history of DVT and hypothyroidism  Past Medical History:  Diagnosis Date  . Depression   . DVT of upper extremity (deep vein thrombosis) ([place]) 3/11  . Hay fever    allergies  . Hyperlipidemia   . Hypothyroidism   . Thyroid cancer [facility])    Past Surgical History:  Procedure Laterality Date  . COLONOSCOPY     01/27/2006. Normal  . THYROIDECTOMY     2000    reports that he quit smoking about 33 years ago. He has quit using smokeless tobacco. He reports current alcohol use. He reports that he does not use drugs. family history includes Alzheimer's disease in his mother; Breast cancer in an other family member; Colon cancer in an other family member; Diabetes in an other family member; Heart disease in an other family member; Hypertension in his mother and another family member; Stroke in an other family member. Allergies  Allergen Reactions  . Ezetimibe-Simvastatin     REACTION: rash X 2-3 months     Review of Systems  Constitutional: Negative for chills and fever.  Neurological: Negative for weakness.       Objective:   Physical Exam Constitutional:      Appearance: Normal appearance.  Cardiovascular:     Rate and Rhythm: Normal rate and regular rhythm.  Pulmonary:     Effort: Pulmonary effort is normal.     Breath sounds: Normal breath sounds.  Musculoskeletal:     Comments: Knee reveals no effusion.  No warmth.  No  erythema.  No localized tenderness.  Good range of motion.  No ligament instability  Neurological:     Mental Status: He is alert.        Assessment:     1 year history of progressive left knee pain.  X-ray shows no significant degenerative changes.  No evidence for fracture.  This will be over read ?meniscus injury.  He does have history of gout but clinically does not fit for gout.  Doubt pseudogout.    Plan:     -Obtain x-ray left knee-as above -We discussed options including orthopedic referral, sports medicine referral, or further imaging with MRI and at this point he would like to observe.  Avoid nonsteroidals with his chronic Coumadin use  Eulas Post MD [facility] at [facility]

## 2018-06-09 NOTE — Patient Instructions (Signed)
Monitor blood pressure and be in touch if consistently > 140/90.   

## 2018-06-09 NOTE — Patient Instructions (Signed)
Pre visit review using our clinic review tool, if applicable. No additional management support is needed unless otherwise documented below in the visit note.  Continue to take  take 1/2 tablet daily except 1 tablet on Wednesdays.  Re-check in 6 weeks.

## 2018-06-30 ENCOUNTER — Other Ambulatory Visit: Payer: Self-pay | Admitting: Family Medicine

## 2018-07-21 ENCOUNTER — Other Ambulatory Visit: Payer: Self-pay

## 2018-07-21 ENCOUNTER — Ambulatory Visit: Payer: 59

## 2018-07-21 ENCOUNTER — Other Ambulatory Visit (INDEPENDENT_AMBULATORY_CARE_PROVIDER_SITE_OTHER): Payer: 59

## 2018-07-21 ENCOUNTER — Ambulatory Visit (INDEPENDENT_AMBULATORY_CARE_PROVIDER_SITE_OTHER): Payer: 59 | Admitting: General Practice

## 2018-07-21 DIAGNOSIS — R7989 Other specified abnormal findings of blood chemistry: Secondary | ICD-10-CM | POA: Diagnosis not present

## 2018-07-21 DIAGNOSIS — Z7901 Long term (current) use of anticoagulants: Secondary | ICD-10-CM | POA: Diagnosis not present

## 2018-07-21 LAB — TSH: TSH: 25.1 u[IU]/mL — ABNORMAL HIGH (ref 0.35–4.50)

## 2018-07-21 LAB — T4, FREE: Free T4: 0.38 ng/dL — ABNORMAL LOW (ref 0.60–1.60)

## 2018-07-21 LAB — POCT INR: INR: 1.9 — AB (ref 2.0–3.0)

## 2018-07-21 NOTE — Patient Instructions (Addendum)
Pre visit review using our clinic review tool, if applicable. No additional management support is needed unless otherwise documented below in the visit note.  Take 1 1/2 tablets today (6/24) and then continue to take  take 1/2 tablet daily except 1 tablet on Wednesdays.  Re-check in 4 weeks. Left dosing instructions on patient's VM.

## 2018-07-27 ENCOUNTER — Ambulatory Visit (INDEPENDENT_AMBULATORY_CARE_PROVIDER_SITE_OTHER): Payer: 59 | Admitting: Family Medicine

## 2018-07-27 ENCOUNTER — Other Ambulatory Visit: Payer: Self-pay

## 2018-07-27 DIAGNOSIS — E039 Hypothyroidism, unspecified: Secondary | ICD-10-CM | POA: Diagnosis not present

## 2018-07-27 MED ORDER — LEVOTHYROXINE SODIUM 75 MCG PO TABS: 75.0000 ug | ORAL_TABLET | Freq: Every day | ORAL | 3 refills | Status: DC

## 2018-07-27 NOTE — Progress Notes (Signed)
Patient ID: Tyler Atkinson, male   DOB: 08/22/1955, 63 y.o.   MRN: [id number]  This visit type was conducted due to national recommendations for restrictions regarding the COVID-19 pandemic in an effort to limit this patient's exposure and mitigate transmission in our community.   Virtual Visit via Video Note  I connected with Birdena Jubilee on 07/27/18 at  1:15 PM EDT by a video enabled telemedicine application and verified that I am speaking with the correct person using two identifiers.  Location patient: home Location provider:work or home office Persons participating in the virtual visit: patient, provider  I discussed the limitations of evaluation and management by telemedicine and the availability of in person appointments. The patient expressed understanding and agreed to proceed.   HPI: Patient has hypothyroidism.  His TSH is been up and down is somewhat difficult to regulate recently.  We had recommended follow-up to discuss.  Currently on levothyroxine 50 mcg daily.  He had been taking Cytomel 1 daily and then discovered that his dose was to be 1/2 tablet daily and after switching to 1/2 tablet of Cytomel and reducing his levothyroxine to 50 mcg his recent TSH came back 25 with low free T4.  He has had some lethargy and cold intolerance.  Feeling somewhat better now.   ROS: See pertinent positives and negatives per HPI.  Past Medical History:  Diagnosis Date  . Depression   . DVT of upper extremity (deep vein thrombosis) ([place]) 3/11  . Hay fever    allergies  . Hyperlipidemia   . Hypothyroidism   . Thyroid cancer [facility])     Past Surgical History:  Procedure Laterality Date  . COLONOSCOPY     01/27/2006. Normal  . THYROIDECTOMY     2000    Family History  Problem Relation Age of Onset  . Colon cancer Other        blood relative  . Breast cancer Other        Grandparent  . Heart disease Other        blood relative  . Hypertension Other   . Stroke Other   . Diabetes  Other   . Hypertension Mother   . Alzheimer's disease Mother     SOCIAL HX: Quit smoking 1987   Current Outpatient Medications:  .  desonide (DESOWEN) 0.05 % cream, Apply topically 2 (two) times daily., Disp: 60 g, Rfl: 2 .  fexofenadine-pseudoephedrine (ALLEGRA-D) 60-120 MG per tablet, Take 1 tablet by mouth daily as needed., Disp: , Rfl:  .  levothyroxine (SYNTHROID) 75 MCG tablet, Take 1 tablet (75 mcg total) by mouth daily., Disp: 90 tablet, Rfl: 3 .  liothyronine (CYTOMEL) 25 MCG tablet, Take 0.5 tablets (12.5 mcg total) by mouth daily., Disp: 45 tablet, Rfl: 1 .  rosuvastatin (CRESTOR) 5 MG tablet, TAKE 1 TABLET(5 MG) BY MOUTH DAILY, Disp: 90 tablet, Rfl: 1 .  sertraline (ZOLOFT) 50 MG tablet, TAKE 1 TABLET(50 MG) BY MOUTH DAILY, Disp: 90 tablet, Rfl: 3 .  tadalafil (CIALIS) 20 MG tablet, ONE BY MOUTH EVERY OTHER DAY AS NEEDED, Disp: 6 tablet, Rfl: 6 .  traMADol (ULTRAM) 50 MG tablet, TAKE 1 TABLET BY MOUTH EVERY 12 HOURS AS NEEDED, Disp: 60 tablet, Rfl: 5 .  warfarin (COUMADIN) 5 MG tablet, TAKE 1/2 TABLET DAILY EXCEPT 1 TABLET ON WEDNESDAY OR TAKE AS DIRECTED BY ANTICOAGULATION CLINIC  90 day, Disp: 60 tablet, Rfl: 1  EXAM:  VITALS per patient if applicable:  GENERAL: alert, oriented, appears well  and in no acute distress  HEENT: atraumatic, conjunttiva clear, no obvious abnormalities on inspection of external nose and ears  NECK: normal movements of the head and neck  LUNGS: on inspection no signs of respiratory distress, breathing rate appears normal, no obvious gross SOB, gasping or wheezing  CV: no obvious cyanosis  MS: moves all visible extremities without noticeable abnormality  PSYCH/NEURO: pleasant and cooperative, no obvious depression or anxiety, speech and thought processing grossly intact  ASSESSMENT AND PLAN:  Discussed the following assessment and plan:  Hypothyroidism.  Currently under replaced by recent labs  -Increase levothyroxine to 75 mcg daily.   He will continue his dose of Cytomel 1/2 tablet daily -Recheck TSH and free T4 in about 6 to 8 weeks -Reviewed proper taking of levothyroxine on empty stomach first thing in the morning    I discussed the assessment and treatment plan with the patient. The patient was provided an opportunity to ask questions and all were answered. The patient agreed with the plan and demonstrated an understanding of the instructions.   The patient was advised to call back or seek an in-person evaluation if the symptoms worsen or if the condition fails to improve as anticipated.   Carolann Littler, MD

## 2018-08-17 ENCOUNTER — Telehealth: Payer: Self-pay | Admitting: *Deleted

## 2018-08-17 NOTE — Telephone Encounter (Signed)
Copied from [place] [id number]. Topic: Appointment Scheduling - Scheduling Inquiry for Clinic >> Aug 17, 2018 11:25 AM Sheran Luz wrote: Reason for CRM: Patient would like to cancel appointment for tomorrow. Patient is requesting call back. Unable to reach office x3.  Appointment canceled

## 2018-08-18 ENCOUNTER — Ambulatory Visit: Payer: 59

## 2018-08-18 ENCOUNTER — Other Ambulatory Visit: Payer: Self-pay | Admitting: Family Medicine

## 2018-08-18 NOTE — Telephone Encounter (Signed)
Last OV 07/27/18, No future OV  Last filled 02/12/18, # 60 with 5 refills

## 2018-09-01 ENCOUNTER — Other Ambulatory Visit (INDEPENDENT_AMBULATORY_CARE_PROVIDER_SITE_OTHER): Payer: 59

## 2018-09-01 ENCOUNTER — Ambulatory Visit (INDEPENDENT_AMBULATORY_CARE_PROVIDER_SITE_OTHER): Payer: 59 | Admitting: General Practice

## 2018-09-01 ENCOUNTER — Other Ambulatory Visit: Payer: Self-pay

## 2018-09-01 DIAGNOSIS — Z7901 Long term (current) use of anticoagulants: Secondary | ICD-10-CM | POA: Diagnosis not present

## 2018-09-01 DIAGNOSIS — E039 Hypothyroidism, unspecified: Secondary | ICD-10-CM | POA: Diagnosis not present

## 2018-09-01 LAB — POCT INR: INR: 2.1 (ref 2.0–3.0)

## 2018-09-01 LAB — T4, FREE: Free T4: 0.84 ng/dL (ref 0.60–1.60)

## 2018-09-01 LAB — TSH: TSH: 8.35 u[IU]/mL — ABNORMAL HIGH (ref 0.35–4.50)

## 2018-09-01 NOTE — Patient Instructions (Addendum)
.  Pre visit review using our clinic review tool, if applicable. No additional management support is needed unless otherwise documented below in the visit note.  Change dosage and take 1/2 daily except 1 tablet on Wed and Saturdays.  Re-check in 6 weeks.

## 2018-09-03 ENCOUNTER — Other Ambulatory Visit: Payer: Self-pay

## 2018-09-03 MED ORDER — LEVOTHYROXINE SODIUM 88 MCG PO TABS: 88.0000 ug | ORAL_TABLET | Freq: Every day | ORAL | 1 refills | Status: DC

## 2018-09-09 ENCOUNTER — Ambulatory Visit: Payer: Self-pay

## 2018-09-09 NOTE — Telephone Encounter (Signed)
Provided Lab results per Dr.  Elease Hashimoto of  09/03/18.  Pt. Voices understanding.

## 2018-10-13 ENCOUNTER — Other Ambulatory Visit: Payer: Self-pay

## 2018-10-13 ENCOUNTER — Ambulatory Visit (INDEPENDENT_AMBULATORY_CARE_PROVIDER_SITE_OTHER): Payer: 59 | Admitting: Family Medicine

## 2018-10-13 DIAGNOSIS — Z7901 Long term (current) use of anticoagulants: Secondary | ICD-10-CM | POA: Diagnosis not present

## 2018-10-13 LAB — POCT INR: INR: 2.7 (ref 2.0–3.0)

## 2018-10-13 NOTE — Patient Instructions (Signed)
Pre visit review using our clinic review tool, if applicable. No additional management support is needed unless otherwise documented below in the visit note.  Continue to take 1/2 daily except 1 tablet on Wed and Saturdays.  Re-check in 6 weeks.  

## 2018-11-24 ENCOUNTER — Telehealth: Payer: Self-pay | Admitting: Family Medicine

## 2018-11-24 ENCOUNTER — Other Ambulatory Visit: Payer: Self-pay

## 2018-11-24 ENCOUNTER — Ambulatory Visit (INDEPENDENT_AMBULATORY_CARE_PROVIDER_SITE_OTHER): Payer: 59 | Admitting: General Practice

## 2018-11-24 DIAGNOSIS — Z7901 Long term (current) use of anticoagulants: Secondary | ICD-10-CM

## 2018-11-24 DIAGNOSIS — E039 Hypothyroidism, unspecified: Secondary | ICD-10-CM

## 2018-11-24 DIAGNOSIS — Z23 Encounter for immunization: Secondary | ICD-10-CM | POA: Diagnosis not present

## 2018-11-24 LAB — POCT INR: INR: 2.4 (ref 2.0–3.0)

## 2018-11-24 MED ORDER — LIOTHYRONINE SODIUM 25 MCG PO TABS: 12.5000 ug | ORAL_TABLET | Freq: Every day | ORAL | 2 refills | Status: DC

## 2018-11-24 MED ORDER — LEVOTHYROXINE SODIUM 88 MCG PO TABS: 88.0000 ug | ORAL_TABLET | Freq: Every day | ORAL | 1 refills | Status: DC

## 2018-11-24 NOTE — Telephone Encounter (Signed)
disregard

## 2018-11-24 NOTE — Telephone Encounter (Signed)
Refills sent in, lab order was placed will see if the scheduler can contact pt for lab visit

## 2018-11-24 NOTE — Telephone Encounter (Signed)
Patient needs refills on Liothyronine and Rosuvastatin.  He is completely out of both medications.  *[facility] in [place]  Patient also needs an order for Thyroid check.  He is due for a thyroid check on November 5th.

## 2018-11-24 NOTE — Patient Instructions (Signed)
Pre visit review using our clinic review tool, if applicable. No additional management support is needed unless otherwise documented below in the visit note.  Continue to take 1/2 daily except 1 tablet on Wed and Saturdays.  Re-check in 6 weeks.  

## 2018-12-03 ENCOUNTER — Other Ambulatory Visit: Payer: Self-pay | Admitting: Family Medicine

## 2018-12-03 MED ORDER — ROSUVASTATIN CALCIUM 5 MG PO TABS: ORAL_TABLET | ORAL | 1 refills | Status: DC

## 2019-01-04 ENCOUNTER — Other Ambulatory Visit: Payer: Self-pay

## 2019-01-05 ENCOUNTER — Other Ambulatory Visit: Payer: 59

## 2019-01-05 ENCOUNTER — Other Ambulatory Visit: Payer: Self-pay

## 2019-01-05 ENCOUNTER — Ambulatory Visit (INDEPENDENT_AMBULATORY_CARE_PROVIDER_SITE_OTHER): Payer: 59 | Admitting: General Practice

## 2019-01-05 DIAGNOSIS — Z7901 Long term (current) use of anticoagulants: Secondary | ICD-10-CM | POA: Diagnosis not present

## 2019-01-05 DIAGNOSIS — E039 Hypothyroidism, unspecified: Secondary | ICD-10-CM

## 2019-01-05 LAB — TSH: TSH: 0.5 u[IU]/mL (ref 0.35–4.50)

## 2019-01-05 LAB — POCT INR: INR: 2.8 (ref 2.0–3.0)

## 2019-01-05 NOTE — Patient Instructions (Addendum)
Pre visit review using our clinic review tool, if applicable. No additional management support is needed unless otherwise documented below in the visit note.  Continue to take 1/2 daily except 1 tablet on Wed and Saturdays.  Re-check in 6 weeks.  

## 2019-01-14 ENCOUNTER — Other Ambulatory Visit: Payer: Self-pay | Admitting: Family Medicine

## 2019-01-14 NOTE — Telephone Encounter (Signed)
Please advise 

## 2019-02-07 ENCOUNTER — Other Ambulatory Visit: Payer: Self-pay

## 2019-02-08 ENCOUNTER — Ambulatory Visit (INDEPENDENT_AMBULATORY_CARE_PROVIDER_SITE_OTHER): Payer: Medicare Other | Admitting: Family Medicine

## 2019-02-08 ENCOUNTER — Other Ambulatory Visit: Payer: Self-pay

## 2019-02-08 ENCOUNTER — Encounter: Payer: 59 | Admitting: Family Medicine

## 2019-02-08 ENCOUNTER — Encounter: Payer: Self-pay | Admitting: Family Medicine

## 2019-02-08 VITALS — BP 130/80 | HR 92 | Temp 97.9°F | Ht 69.1 in | Wt 171.9 lb

## 2019-02-08 DIAGNOSIS — Z0000 Encounter for general adult medical examination without abnormal findings: Secondary | ICD-10-CM | POA: Diagnosis not present

## 2019-02-08 DIAGNOSIS — R0789 Other chest pain: Secondary | ICD-10-CM

## 2019-02-08 DIAGNOSIS — Z125 Encounter for screening for malignant neoplasm of prostate: Secondary | ICD-10-CM | POA: Diagnosis not present

## 2019-02-08 DIAGNOSIS — E785 Hyperlipidemia, unspecified: Secondary | ICD-10-CM | POA: Diagnosis not present

## 2019-02-08 DIAGNOSIS — Z23 Encounter for immunization: Secondary | ICD-10-CM

## 2019-02-08 DIAGNOSIS — Z136 Encounter for screening for cardiovascular disorders: Secondary | ICD-10-CM

## 2019-02-08 DIAGNOSIS — E039 Hypothyroidism, unspecified: Secondary | ICD-10-CM

## 2019-02-08 DIAGNOSIS — Z87891 Personal history of nicotine dependence: Secondary | ICD-10-CM

## 2019-02-08 DIAGNOSIS — Z1211 Encounter for screening for malignant neoplasm of colon: Secondary | ICD-10-CM

## 2019-02-08 DIAGNOSIS — Z86718 Personal history of other venous thrombosis and embolism: Secondary | ICD-10-CM | POA: Diagnosis not present

## 2019-02-08 DIAGNOSIS — Z7901 Long term (current) use of anticoagulants: Secondary | ICD-10-CM

## 2019-02-08 LAB — HEPATIC FUNCTION PANEL
ALT: 20 U/L (ref 0–53)
AST: 19 U/L (ref 0–37)
Albumin: 4.3 g/dL (ref 3.5–5.2)
Alkaline Phosphatase: 69 U/L (ref 39–117)
Bilirubin, Direct: 0.1 mg/dL (ref 0.0–0.3)
Total Bilirubin: 0.5 mg/dL (ref 0.2–1.2)
Total Protein: 6.6 g/dL (ref 6.0–8.3)

## 2019-02-08 LAB — LIPID PANEL
Cholesterol: 210 mg/dL — ABNORMAL HIGH (ref 0–200)
HDL: 47.9 mg/dL (ref >39.00)
NonHDL: 161.71
Total CHOL/HDL Ratio: 4
Triglycerides: 212 mg/dL — ABNORMAL HIGH (ref 0.0–149.0)
VLDL: 42.4 mg/dL — ABNORMAL HIGH (ref 0.0–40.0)

## 2019-02-08 LAB — CBC WITH DIFFERENTIAL/PLATELET
Basophils Absolute: 0.1 10*3/uL (ref 0.0–0.1)
Basophils Relative: 1.2 % (ref 0.0–3.0)
Eosinophils Absolute: 0.1 10*3/uL (ref 0.0–0.7)
Eosinophils Relative: 1.7 % (ref 0.0–5.0)
HCT: 40.4 % (ref 39.0–52.0)
Hemoglobin: 13.7 g/dL (ref 13.0–17.0)
Lymphocytes Relative: 28.8 % (ref 12.0–46.0)
Lymphs Abs: 2.1 10*3/uL (ref 0.7–4.0)
MCHC: 33.8 g/dL (ref 30.0–36.0)
MCV: 93.9 fl (ref 78.0–100.0)
Monocytes Absolute: 0.8 10*3/uL (ref 0.1–1.0)
Monocytes Relative: 10.6 % (ref 3.0–12.0)
Neutro Abs: 4.2 10*3/uL (ref 1.4–7.7)
Neutrophils Relative %: 57.7 % (ref 43.0–77.0)
Platelets: 287 10*3/uL (ref 150.0–400.0)
RBC: 4.3 Mil/uL (ref 4.22–5.81)
RDW: 12.8 % (ref 11.5–15.5)
WBC: 7.3 10*3/uL (ref 4.0–10.5)

## 2019-02-08 LAB — BASIC METABOLIC PANEL
BUN: 12 mg/dL (ref 6–23)
CO2: 27 mEq/L (ref 19–32)
Calcium: 9 mg/dL (ref 8.4–10.5)
Chloride: 106 mEq/L (ref 96–112)
Creatinine, Ser: 0.8 mg/dL (ref 0.40–1.50)
GFR: 96.97 mL/min (ref >60.00)
Glucose, Bld: 97 mg/dL (ref 70–99)
Potassium: 4 mEq/L (ref 3.5–5.1)
Sodium: 141 mEq/L (ref 135–145)

## 2019-02-08 LAB — PSA, MEDICARE: PSA: 0.57 ng/ml (ref 0.10–4.00)

## 2019-02-08 LAB — LDL CHOLESTEROL, DIRECT: Direct LDL: 133 mg/dL

## 2019-02-08 MED ORDER — TRAMADOL HCL 50 MG PO TABS: 50.0000 mg | ORAL_TABLET | Freq: Two times a day (BID) | ORAL | 5 refills | Status: DC | PRN

## 2019-02-08 MED ORDER — ROSUVASTATIN CALCIUM 5 MG PO TABS: ORAL_TABLET | ORAL | 3 refills | Status: DC

## 2019-02-08 MED ORDER — LIOTHYRONINE SODIUM 25 MCG PO TABS: 12.5000 ug | ORAL_TABLET | Freq: Every day | ORAL | 3 refills | Status: DC

## 2019-02-08 MED ORDER — SERTRALINE HCL 50 MG PO TABS: ORAL_TABLET | ORAL | 3 refills | Status: DC

## 2019-02-08 MED ORDER — LEVOTHYROXINE SODIUM 88 MCG PO TABS: 88.0000 ug | ORAL_TABLET | Freq: Every day | ORAL | 3 refills | Status: DC

## 2019-02-08 NOTE — Progress Notes (Signed)
Subjective:     Patient ID: Tyler Atkinson, male   DOB: [birth year], 64 y.o.   MRN: [id number]  HPI   Tyler Atkinson is seen for welcome to Medicare physical exam and medical follow-up.  His chronic problems include history of unprovoked DVT, chronic Coumadin use, hypothyroidism, hyperlipidemia, history of guttate psoriasis.  Needs refills of several medications today including tramadol (which he takes for some chronic hip pain), Crestor, Zoloft, levothyroxine, and Cytomel.  Compliant with all medications.  He had recent TSH which was finally at goal  He had some recent transient atypical chest pains which he states is anterior chest without radiation and not exertional and usually lasting several seconds.  No associated nausea or vomiting.  No diaphoresis.  No pleuritic pain.  No cough  He is due for repeat colonoscopy.  He had planned to go last summer before the pandemic.  He needs Prevnar 13.  Flu vaccine already given.  He did smoke until 21.  No history of abdominal aortic aneurysm screening.  Past Medical History:  Diagnosis Date  . Depression   . DVT of upper extremity (deep vein thrombosis) ([place]) 3/11  . Hay fever    allergies  . Hyperlipidemia   . Hypothyroidism   . Thyroid cancer [facility])    Past Surgical History:  Procedure Laterality Date  . COLONOSCOPY     01/27/2006. Normal  . THYROIDECTOMY     2000    reports that he quit smoking about 34 years ago. He has quit using smokeless tobacco. He reports current alcohol use. He reports that he does not use drugs. family history includes Alzheimer's disease in his mother; Breast cancer in an other family member; Colon cancer in an other family member; Diabetes in an other family member; Heart disease in an other family member; Hypertension in his mother and another family member; Stroke in an other family member. Allergies  Allergen Reactions  . Ezetimibe-Simvastatin     REACTION: rash X 2-3 months   1.  Risk factors based on Past  Medical , Social, and Family history reviewed and as indicated above with no changes  2.  Limitations in physical activities None.  No recent falls.  No formal exercise  3.  Depression/mood No active depression or anxiety issues  PHQ-2=0  4.  Hearing No deficits  5.  ADLs independent in all.  6.  Cognitive function (orientation to time and place, language, writing, speech,memory) no short or long term memory issues.  Language and judgement intact.  7.  Home Safety no issues  8.  Height, weight, and visual acuity.all stable.  He has lost a few pounds possible related to adjustment in thyroid medication  Wt Readings from Last 3 Encounters:  02/08/19 171 lb 14.4 oz (78 kg)  06/09/18 178 lb 3.2 oz (80.8 kg)  02/02/18 175 lb 12.8 oz (79.7 kg)    9.  Counseling discussed -Counseled regarding age and gender appropriate preventative screenings and immunizations.  10. Recommendation of preventive services.  Prevnar 13 and recommend pneumovax in one year.  Recommend follow up colonoscopy and AAA screen  11. Labs based on risk factors-lipid, hepatic, CBC, BMP, PSA  12. Care Plan- as below.  13. Other Providers- Dr Ardis Hughs (GI)  63. Written schedule of screening/prevention services given to patient.  15 advance directives-none currently but these were discussed in some detail.  He plans to set this up  Review of Systems  Constitutional: Negative for activity change, appetite change, fatigue, fever and  unexpected weight change.  HENT: Negative for congestion, ear pain and trouble swallowing.   Eyes: Negative for pain and visual disturbance.  Respiratory: Negative for cough, shortness of breath and wheezing.   Cardiovascular: Negative for palpitations.  Gastrointestinal: Negative for abdominal distention, abdominal pain, blood in stool, constipation, diarrhea, nausea, rectal pain and vomiting.  Endocrine: Negative for polydipsia and polyuria.  Genitourinary: Negative for dysuria,  hematuria and testicular pain.  Musculoskeletal: Negative for arthralgias and joint swelling.  Skin: Negative for rash.  Neurological: Negative for dizziness, syncope and headaches.  Hematological: Negative for adenopathy.  Psychiatric/Behavioral: Negative for confusion and dysphoric mood.       Objective:   Physical Exam Constitutional:      General: He is not in acute distress.    Appearance: He is well-developed.  HENT:     Head: Normocephalic and atraumatic.     Right Ear: External ear normal.     Left Ear: External ear normal.  Eyes:     Conjunctiva/sclera: Conjunctivae normal.     Pupils: Pupils are equal, round, and reactive to light.  Neck:     Thyroid: No thyromegaly.  Cardiovascular:     Rate and Rhythm: Normal rate and regular rhythm.     Heart sounds: Normal heart sounds. No murmur.  Pulmonary:     Effort: No respiratory distress.     Breath sounds: No wheezing or rales.  Abdominal:     General: Bowel sounds are normal. There is no distension.     Palpations: Abdomen is soft. There is no mass.     Tenderness: There is no abdominal tenderness. There is no guarding or rebound.  Musculoskeletal:     Cervical back: Normal range of motion and neck supple.  Lymphadenopathy:     Cervical: No cervical adenopathy.  Skin:    Findings: No rash.     Comments: Generally very dry skin.  No concerning lesions noted.  Neurological:     Mental Status: He is alert and oriented to person, place, and time.     Cranial Nerves: No cranial nerve deficit.     Deep Tendon Reflexes: Reflexes normal.        Assessment:     #1 Welcome to Medicare physical exam  #2 history of unprovoked DVT upper extremity on chronic coumadin  #3 hypothyroidism with recent TSH at goal  #4 past history of nicotine use  #5 hyperlipidemia treated with Crestor    Plan:     -Refilled all regular medications for 1 year- Crestor, Zoloft, levothyroxine, Cytomel and Tramadol for 6 months -Flu  vaccine already given -Set up abdominal aortic aneurysm screening with prior history of nicotine use -Prevnar 13 given and recommend Pneumovax in 1 year -Set up referral for repeat colonoscopy -Obtain follow-up labs -The natural history of prostate cancer and ongoing controversy regarding screening and potential treatment outcomes of prostate cancer has been discussed with the patient. The meaning of a false positive PSA and a false negative PSA has been discussed. He indicates understanding of the limitations of this screening test and wishes - to proceed with screening PSA testing. -Check baseline EKG- shows NSR with no acute ST-T changes -Continue regular follow-up in Coumadin clinic  Eulas Post MD  Primary Care at [facility]

## 2019-02-08 NOTE — Patient Instructions (Signed)

## 2019-02-09 ENCOUNTER — Other Ambulatory Visit: Payer: Self-pay

## 2019-02-09 DIAGNOSIS — E785 Hyperlipidemia, unspecified: Secondary | ICD-10-CM

## 2019-02-15 ENCOUNTER — Other Ambulatory Visit: Payer: Self-pay

## 2019-02-16 ENCOUNTER — Ambulatory Visit (INDEPENDENT_AMBULATORY_CARE_PROVIDER_SITE_OTHER): Payer: Medicare Other | Admitting: General Practice

## 2019-02-16 DIAGNOSIS — Z7901 Long term (current) use of anticoagulants: Secondary | ICD-10-CM | POA: Diagnosis not present

## 2019-02-16 LAB — POCT INR: INR: 2.7 (ref 2.0–3.0)

## 2019-02-16 NOTE — Patient Instructions (Signed)
Pre visit review using our clinic review tool, if applicable. No additional management support is needed unless otherwise documented below in the visit note.  Continue to take 1/2 daily except 1 tablet on Wed and Saturdays.  Re-check in 6 weeks.  

## 2019-03-29 ENCOUNTER — Other Ambulatory Visit: Payer: Self-pay

## 2019-03-30 ENCOUNTER — Ambulatory Visit (INDEPENDENT_AMBULATORY_CARE_PROVIDER_SITE_OTHER): Payer: Medicare Other | Admitting: General Practice

## 2019-03-30 DIAGNOSIS — Z7901 Long term (current) use of anticoagulants: Secondary | ICD-10-CM

## 2019-03-30 LAB — POCT INR: INR: 2.9 (ref 2.0–3.0)

## 2019-03-30 NOTE — Patient Instructions (Addendum)
Pre visit review using our clinic review tool, if applicable. No additional management support is needed unless otherwise documented below in the visit note.  Continue to take 1/2 daily except 1 tablet on Wed and Saturdays.  Re-check in 6 weeks.  

## 2019-04-18 ENCOUNTER — Encounter: Payer: Self-pay | Admitting: Family Medicine

## 2019-04-21 ENCOUNTER — Other Ambulatory Visit: Payer: Self-pay | Admitting: Family Medicine

## 2019-04-25 ENCOUNTER — Telehealth: Payer: Self-pay | Admitting: Family Medicine

## 2019-04-25 ENCOUNTER — Other Ambulatory Visit: Payer: Self-pay | Admitting: General Practice

## 2019-04-25 DIAGNOSIS — Z7901 Long term (current) use of anticoagulants: Secondary | ICD-10-CM

## 2019-04-25 MED ORDER — WARFARIN SODIUM 5 MG PO TABS: ORAL_TABLET | ORAL | 1 refills | Status: DC

## 2019-04-25 NOTE — Telephone Encounter (Signed)
Please advise 

## 2019-04-25 NOTE — Telephone Encounter (Signed)
Medication Refill: Warfarin  Pharmacy: [facility] [place]: [phone]

## 2019-05-11 ENCOUNTER — Ambulatory Visit: Payer: Medicare Other

## 2019-05-24 ENCOUNTER — Other Ambulatory Visit: Payer: Self-pay

## 2019-05-25 ENCOUNTER — Ambulatory Visit (INDEPENDENT_AMBULATORY_CARE_PROVIDER_SITE_OTHER): Payer: Medicare Other | Admitting: General Practice

## 2019-05-25 DIAGNOSIS — Z7901 Long term (current) use of anticoagulants: Secondary | ICD-10-CM

## 2019-05-25 LAB — POCT INR: INR: 2.4 (ref 2.0–3.0)

## 2019-05-25 NOTE — Patient Instructions (Addendum)
Pre visit review using our clinic review tool, if applicable. No additional management support is needed unless otherwise documented below in the visit note.  Continue to take 1/2 daily except 1 tablet on Wed and Saturdays.  Re-check in 6 weeks.  

## 2019-07-05 ENCOUNTER — Other Ambulatory Visit: Payer: Self-pay

## 2019-07-06 ENCOUNTER — Ambulatory Visit (INDEPENDENT_AMBULATORY_CARE_PROVIDER_SITE_OTHER): Payer: Medicare Other | Admitting: General Practice

## 2019-07-06 DIAGNOSIS — Z7901 Long term (current) use of anticoagulants: Secondary | ICD-10-CM | POA: Diagnosis not present

## 2019-07-06 LAB — POCT INR: INR: 2.2 (ref 2.0–3.0)

## 2019-07-06 NOTE — Patient Instructions (Signed)
Pre visit review using our clinic review tool, if applicable. No additional management support is needed unless otherwise documented below in the visit note.  Continue to take 1/2 daily except 1 tablet on Wed and Saturdays.  Re-check in 6 weeks.

## 2019-08-04 ENCOUNTER — Other Ambulatory Visit: Payer: Self-pay | Admitting: Family Medicine

## 2019-08-04 DIAGNOSIS — Z7901 Long term (current) use of anticoagulants: Secondary | ICD-10-CM

## 2019-08-05 NOTE — Telephone Encounter (Signed)
Please advise 

## 2019-08-12 ENCOUNTER — Other Ambulatory Visit: Payer: Self-pay | Admitting: Family Medicine

## 2019-08-12 NOTE — Telephone Encounter (Signed)
Please advise 

## 2019-08-17 ENCOUNTER — Ambulatory Visit (INDEPENDENT_AMBULATORY_CARE_PROVIDER_SITE_OTHER): Payer: Medicare Other | Admitting: General Practice

## 2019-08-17 ENCOUNTER — Ambulatory Visit: Payer: No Typology Code available for payment source

## 2019-08-17 ENCOUNTER — Other Ambulatory Visit: Payer: Self-pay

## 2019-08-17 DIAGNOSIS — Z7901 Long term (current) use of anticoagulants: Secondary | ICD-10-CM | POA: Diagnosis not present

## 2019-08-17 LAB — POCT INR: INR: 1.7 — AB (ref 2.0–3.0)

## 2019-08-17 NOTE — Patient Instructions (Addendum)
Pre visit review using our clinic review tool, if applicable. No additional management support is needed unless otherwise documented below in the visit note.  Take extra 1/2 tablet today and then change dosage and take 1/2 daily except 1 tablet on Mondays Wed and Saturdays.  Re-check in 4 weeks.

## 2019-09-14 ENCOUNTER — Other Ambulatory Visit: Payer: Self-pay

## 2019-09-14 ENCOUNTER — Ambulatory Visit (INDEPENDENT_AMBULATORY_CARE_PROVIDER_SITE_OTHER): Payer: Medicare Other | Admitting: General Practice

## 2019-09-14 DIAGNOSIS — Z7901 Long term (current) use of anticoagulants: Secondary | ICD-10-CM

## 2019-09-14 LAB — POCT INR: INR: 2.2 (ref 2.0–3.0)

## 2019-09-14 NOTE — Patient Instructions (Addendum)
Pre visit review using our clinic review tool, if applicable. No additional management support is needed unless otherwise documented below in the visit note.  Continue take 1/2 daily except 1 tablet on Mondays Wed and Saturdays.  Re-check in 6 weeks.

## 2019-09-26 ENCOUNTER — Other Ambulatory Visit (HOSPITAL_COMMUNITY): Admission: RE | Admit: 2019-09-26 | Payer: No Typology Code available for payment source | Source: Ambulatory Visit

## 2019-10-26 ENCOUNTER — Ambulatory Visit: Payer: No Typology Code available for payment source

## 2019-10-31 ENCOUNTER — Ambulatory Visit (INDEPENDENT_AMBULATORY_CARE_PROVIDER_SITE_OTHER): Payer: Medicare Other | Admitting: General Practice

## 2019-10-31 ENCOUNTER — Other Ambulatory Visit: Payer: Self-pay

## 2019-10-31 DIAGNOSIS — Z7901 Long term (current) use of anticoagulants: Secondary | ICD-10-CM | POA: Diagnosis not present

## 2019-10-31 DIAGNOSIS — Z23 Encounter for immunization: Secondary | ICD-10-CM | POA: Diagnosis not present

## 2019-10-31 LAB — POCT INR: INR: 2.7 (ref 2.0–3.0)

## 2019-10-31 NOTE — Patient Instructions (Addendum)
Pre visit review using our clinic review tool, if applicable. No additional management support is needed unless otherwise documented below in the visit note. Continue take 1/2 daily except 1 tablet on Mondays Wed and Saturdays.  Re-check in 6 weeks.

## 2019-12-14 ENCOUNTER — Other Ambulatory Visit: Payer: Self-pay

## 2019-12-14 ENCOUNTER — Ambulatory Visit (INDEPENDENT_AMBULATORY_CARE_PROVIDER_SITE_OTHER): Payer: Medicare Other | Admitting: General Practice

## 2019-12-14 DIAGNOSIS — Z7901 Long term (current) use of anticoagulants: Secondary | ICD-10-CM | POA: Diagnosis not present

## 2019-12-14 LAB — POCT INR: INR: 4.4 — AB (ref 2.0–3.0)

## 2019-12-14 NOTE — Patient Instructions (Addendum)
Pre visit review using our clinic review tool, if applicable. No additional management support is needed unless otherwise documented below in the visit note.  Skip dosage today and tomorrow and then continue take 1/2 daily except 1 tablet on Mondays Wed and Saturdays.  Re-check in 3 weeks.

## 2020-01-01 ENCOUNTER — Other Ambulatory Visit: Payer: Self-pay | Admitting: Family Medicine

## 2020-01-01 DIAGNOSIS — E039 Hypothyroidism, unspecified: Secondary | ICD-10-CM

## 2020-01-01 DIAGNOSIS — Z7901 Long term (current) use of anticoagulants: Secondary | ICD-10-CM

## 2020-01-02 ENCOUNTER — Ambulatory Visit (INDEPENDENT_AMBULATORY_CARE_PROVIDER_SITE_OTHER): Payer: Medicare Other | Admitting: General Practice

## 2020-01-02 ENCOUNTER — Other Ambulatory Visit: Payer: Self-pay

## 2020-01-02 DIAGNOSIS — Z7901 Long term (current) use of anticoagulants: Secondary | ICD-10-CM

## 2020-01-02 LAB — POCT INR: INR: 4 — AB (ref 2.0–3.0)

## 2020-01-02 NOTE — Patient Instructions (Addendum)
Pre visit review using our clinic review tool, if applicable. No additional management support is needed unless otherwise documented below in the visit note.  Skip dosage today and tomorrow and then continue take 1/2 daily except 1 tablet on Mondays Wed and Saturdays.  Re-check in 3 weeks.

## 2020-01-24 ENCOUNTER — Other Ambulatory Visit: Payer: Self-pay

## 2020-01-25 ENCOUNTER — Other Ambulatory Visit: Payer: Self-pay | Admitting: General Practice

## 2020-01-25 ENCOUNTER — Ambulatory Visit (INDEPENDENT_AMBULATORY_CARE_PROVIDER_SITE_OTHER): Payer: Medicare Other | Admitting: General Practice

## 2020-01-25 DIAGNOSIS — Z7901 Long term (current) use of anticoagulants: Secondary | ICD-10-CM

## 2020-01-25 LAB — POCT INR: INR: 3.1 — AB (ref 2.0–3.0)

## 2020-01-25 NOTE — Patient Instructions (Addendum)
Pre visit review using our clinic review tool, if applicable. No additional management support is needed unless otherwise documented below in the visit note.  Continue take 1/2 daily except 1 tablet on Mondays Wed .  Re-check in 3 weeks.  Patient will be checked in the lab on 1/19.

## 2020-02-04 ENCOUNTER — Other Ambulatory Visit: Payer: Self-pay | Admitting: Family Medicine

## 2020-02-14 ENCOUNTER — Other Ambulatory Visit: Payer: Self-pay | Admitting: General Practice

## 2020-02-15 ENCOUNTER — Encounter: Payer: Medicare Other | Admitting: Family Medicine

## 2020-02-22 ENCOUNTER — Other Ambulatory Visit: Payer: Self-pay

## 2020-02-22 ENCOUNTER — Ambulatory Visit (INDEPENDENT_AMBULATORY_CARE_PROVIDER_SITE_OTHER): Payer: Medicare Other | Admitting: Family Medicine

## 2020-02-22 ENCOUNTER — Encounter: Payer: Self-pay | Admitting: Family Medicine

## 2020-02-22 VITALS — BP 152/82 | HR 85 | Ht 69.0 in | Wt 174.0 lb

## 2020-02-22 DIAGNOSIS — E785 Hyperlipidemia, unspecified: Secondary | ICD-10-CM | POA: Diagnosis not present

## 2020-02-22 DIAGNOSIS — Z0000 Encounter for general adult medical examination without abnormal findings: Secondary | ICD-10-CM | POA: Diagnosis not present

## 2020-02-22 DIAGNOSIS — Z23 Encounter for immunization: Secondary | ICD-10-CM | POA: Diagnosis not present

## 2020-02-22 DIAGNOSIS — E039 Hypothyroidism, unspecified: Secondary | ICD-10-CM

## 2020-02-22 DIAGNOSIS — Z125 Encounter for screening for malignant neoplasm of prostate: Secondary | ICD-10-CM | POA: Diagnosis not present

## 2020-02-22 DIAGNOSIS — Z7901 Long term (current) use of anticoagulants: Secondary | ICD-10-CM

## 2020-02-22 LAB — PROTIME-INR
INR: 3 ratio — ABNORMAL HIGH (ref 0.8–1.0)
Prothrombin Time: 33.4 s — ABNORMAL HIGH (ref 9.6–13.1)

## 2020-02-22 LAB — LIPID PANEL
Cholesterol: 182 mg/dL (ref 0–200)
HDL: 75.3 mg/dL (ref >39.00)
LDL Cholesterol: 91 mg/dL (ref 0–99)
NonHDL: 106.61
Total CHOL/HDL Ratio: 2
Triglycerides: 76 mg/dL (ref 0.0–149.0)
VLDL: 15.2 mg/dL (ref 0.0–40.0)

## 2020-02-22 LAB — CBC WITH DIFFERENTIAL/PLATELET
Basophils Absolute: 0.1 10*3/uL (ref 0.0–0.1)
Basophils Relative: 1 % (ref 0.0–3.0)
Eosinophils Absolute: 0.2 10*3/uL (ref 0.0–0.7)
Eosinophils Relative: 2.8 % (ref 0.0–5.0)
HCT: 42.8 % (ref 39.0–52.0)
Hemoglobin: 14.5 g/dL (ref 13.0–17.0)
Lymphocytes Relative: 23.5 % (ref 12.0–46.0)
Lymphs Abs: 1.6 10*3/uL (ref 0.7–4.0)
MCHC: 33.8 g/dL (ref 30.0–36.0)
MCV: 92.4 fl (ref 78.0–100.0)
Monocytes Absolute: 0.7 10*3/uL (ref 0.1–1.0)
Monocytes Relative: 10.4 % (ref 3.0–12.0)
Neutro Abs: 4.2 10*3/uL (ref 1.4–7.7)
Neutrophils Relative %: 62.3 % (ref 43.0–77.0)
Platelets: 272 10*3/uL (ref 150.0–400.0)
RBC: 4.64 Mil/uL (ref 4.22–5.81)
RDW: 13.2 % (ref 11.5–15.5)
WBC: 6.8 10*3/uL (ref 4.0–10.5)

## 2020-02-22 LAB — HEPATIC FUNCTION PANEL
ALT: 21 U/L (ref 0–53)
AST: 21 U/L (ref 0–37)
Albumin: 4.6 g/dL (ref 3.5–5.2)
Alkaline Phosphatase: 78 U/L (ref 39–117)
Bilirubin, Direct: 0.1 mg/dL (ref 0.0–0.3)
Total Bilirubin: 0.8 mg/dL (ref 0.2–1.2)
Total Protein: 7.4 g/dL (ref 6.0–8.3)

## 2020-02-22 LAB — BASIC METABOLIC PANEL
BUN: 13 mg/dL (ref 6–23)
CO2: 28 mEq/L (ref 19–32)
Calcium: 9.5 mg/dL (ref 8.4–10.5)
Chloride: 102 mEq/L (ref 96–112)
Creatinine, Ser: 0.81 mg/dL (ref 0.40–1.50)
GFR: 92.08 mL/min (ref >60.00)
Glucose, Bld: 96 mg/dL (ref 70–99)
Potassium: 4.3 mEq/L (ref 3.5–5.1)
Sodium: 139 mEq/L (ref 135–145)

## 2020-02-22 LAB — PSA, MEDICARE: PSA: 0.46 ng/ml (ref 0.10–4.00)

## 2020-02-22 LAB — TSH: TSH: 0.4 u[IU]/mL (ref 0.35–4.50)

## 2020-02-22 MED ORDER — LIOTHYRONINE SODIUM 25 MCG PO TABS
ORAL_TABLET | ORAL | 3 refills | Status: DC
Start: 2020-02-22 — End: 2021-02-22

## 2020-02-22 NOTE — Progress Notes (Unsigned)
Established Patient Office Visit  Subjective:  Patient ID: Tyler Atkinson, male    DOB: 01-03-1956  Age: 65 y.o. MRN: [id number]  CC:  Chief Complaint  Patient presents with  . Annual Exam    HPI HUIE BOBIER presents for Medicare subsequent annual wellness visit and medical follow-up.  Chronic problems include history of DVT, hypothyroidism, history of gout, guttate psoriasis, long-term anticoagulation use with Coumadin, dyslipidemia, chronic hip pain.  No history of Shingrix.  He had Prevnar last year needs Pneumovax.  Overdue for repeat colonoscopy.  1.  Risk factors based on Past Medical , Social, and Family history reviewed and as indicated above with no changes  2.  Limitations in physical activities None.  No recent falls.  3.  Depression/mood No active depression or anxiety issues.   PHQ 2= 0  4.  Hearing No deficits  5.  ADLs independent in all.  6.  Cognitive function (orientation to time and place, language, writing, speech,memory) no short or long term memory issues.  Language and judgement intact.  7.  Home Safety no issues  8.  Height, weight, and visual acuity.all stable.    Wt Readings from Last 3 Encounters:  02/22/20 174 lb (78.9 kg)  02/08/19 171 lb 14.4 oz (78 kg)  06/09/18 178 lb 3.2 oz (80.8 kg)    9.  Counseling discussed Counseled regarding age and gender appropriate preventative screenings and immunizations.  10. Recommendation of preventive services.  Recommend Pneumovax today.  Continue annual flu vaccine.  Consider Shingrix this year.  11. Labs based on risk factors-TSH, PSA, lipid, hepatic, CBC, basic metabolic panel  12. Care Plan-as below  13. Other Providers-none  14. Written schedule of screening/prevention services given to patient. Health Maintenance  Topic Date Due  . COLONOSCOPY (Pts 45-15yrs Insurance coverage will need to be confirmed)  07/14/2016  . COVID-19 Vaccine (3 - Booster for Moderna series) 05/23/2020  .  TETANUS/TDAP  01/22/2026  . INFLUENZA VACCINE  Completed  . Hepatitis C Screening  Completed  . PNA vac Low Risk Adult  Completed        Past Medical History:  Diagnosis Date  . Depression   . DVT of upper extremity (deep vein thrombosis) ([place]) 3/11  . Hay fever    allergies  . Hyperlipidemia   . Hypothyroidism   . Thyroid cancer [facility])     Past Surgical History:  Procedure Laterality Date  . COLONOSCOPY     01/27/2006. Normal  . THYROIDECTOMY     2000    Family History  Problem Relation Age of Onset  . Colon cancer Other        blood relative  . Breast cancer Other        Grandparent  . Heart disease Other        blood relative  . Hypertension Other   . Stroke Other   . Diabetes Other   . Hypertension Mother   . Alzheimer's disease Mother     Social History   Socioeconomic History  . Marital status: Married    Spouse name: Not on file  . Number of children: Not on file  . Years of education: Not on file  . Highest education level: Not on file  Occupational History  . Occupation: Retired Dispensing optician  Tobacco Use  . Smoking status: Former Smoker    Quit date: 01/27/1985    Years since quitting: 35.0  . Smokeless tobacco: Former Network engineer  .  Vaping Use: Never used  Substance and Sexual Activity  . Alcohol use: Yes  . Drug use: No  . Sexual activity: Not on file  Other Topics Concern  . Not on file  Social History Narrative  . Not on file   Social Determinants of Health   Financial Resource Strain: Not on file  Food Insecurity: Not on file  Transportation Needs: Not on file  Physical Activity: Not on file  Stress: Not on file  Social Connections: Not on file  Intimate Partner Violence: Not on file    Outpatient Medications Prior to Visit  Medication Sig Dispense Refill  . desonide (DESOWEN) 0.05 % cream Apply topically 2 (two) times daily. 60 g 2  . fexofenadine-pseudoephedrine (ALLEGRA-D) 60-120 MG per tablet Take 1 tablet  by mouth daily as needed.    Marland Kitchen levothyroxine (SYNTHROID) 88 MCG tablet TAKE 1 TABLET BY MOUTH EVERY DAY 90 tablet 3  . rosuvastatin (CRESTOR) 5 MG tablet TAKE 1 TABLET BY MOUTH EVERY DAY 90 tablet 3  . sertraline (ZOLOFT) 50 MG tablet TAKE 1 TABLET BY MOUTH EVERY DAY 90 tablet 3  . tadalafil (CIALIS) 20 MG tablet ONE BY MOUTH EVERY OTHER DAY AS NEEDED 6 tablet 6  . traMADol (ULTRAM) 50 MG tablet TAKE 1 TABLET (50 MG TOTAL) BY MOUTH EVERY 12 (TWELVE) HOURS AS NEEDED. 60 tablet 5  . warfarin (COUMADIN) 5 MG tablet TAKE 1/2 TABLET DAILY EXCEPT 1 TABLET ON WEDNESDAY OR TAKE AS DIRECTED BY ANTICOAGULATION CLINIC 60 tablet 1  . liothyronine (CYTOMEL) 25 MCG tablet TAKE 1/2 TABLET (12.5 MCG TOTAL) BY MOUTH DAILY. 45 tablet 3   No facility-administered medications prior to visit.    Allergies  Allergen Reactions  . Ezetimibe-Simvastatin     REACTION: rash X 2-3 months    ROS Review of Systems  Constitutional: Negative for fatigue.  Eyes: Negative for visual disturbance.  Respiratory: Negative for cough, chest tightness and shortness of breath.   Cardiovascular: Negative for chest pain, palpitations and leg swelling.  Gastrointestinal: Negative for abdominal pain.  Endocrine: Negative for polydipsia and polyuria.  Genitourinary: Negative for dysuria.  Musculoskeletal: Negative for back pain.  Neurological: Negative for dizziness, syncope, weakness, light-headedness and headaches.  Psychiatric/Behavioral: Negative for agitation.      Objective:    Physical Exam Vitals reviewed.  Constitutional:      Appearance: Normal appearance.  Cardiovascular:     Rate and Rhythm: Normal rate and regular rhythm.  Pulmonary:     Effort: Pulmonary effort is normal.     Breath sounds: Normal breath sounds.  Musculoskeletal:     Cervical back: Neck supple.     Right lower leg: No edema.     Left lower leg: No edema.  Lymphadenopathy:     Cervical: No cervical adenopathy.  Skin:    Comments:  Very dry skin generally.  He has several small seborrheic keratoses.  No concerning lesions at this time.  Neurological:     General: No focal deficit present.     Mental Status: He is alert.     Cranial Nerves: No cranial nerve deficit.     BP (!) 152/82   Pulse 85   Ht 5\' 9"  (1.753 m)   Wt 174 lb (78.9 kg)   SpO2 97%   BMI 25.70 kg/m  Wt Readings from Last 3 Encounters:  02/22/20 174 lb (78.9 kg)  02/08/19 171 lb 14.4 oz (78 kg)  06/09/18 178 lb 3.2 oz (80.8 kg)  Health Maintenance Due  Topic Date Due  . COLONOSCOPY (Pts 45-38yrs Insurance coverage will need to be confirmed)  07/14/2016  . PNA vac Low Risk Adult (2 of 2 - PPSV23) 02/08/2020    There are no preventive care reminders to display for this patient.  Lab Results  Component Value Date   TSH 0.50 01/05/2019   Lab Results  Component Value Date   WBC 7.3 02/08/2019   HGB 13.7 02/08/2019   HCT 40.4 02/08/2019   MCV 93.9 02/08/2019   PLT 287.0 02/08/2019   Lab Results  Component Value Date   NA 141 02/08/2019   K 4.0 02/08/2019   CO2 27 02/08/2019   GLUCOSE 97 02/08/2019   BUN 12 02/08/2019   CREATININE 0.80 02/08/2019   BILITOT 0.5 02/08/2019   ALKPHOS 69 02/08/2019   AST 19 02/08/2019   ALT 20 02/08/2019   PROT 6.6 02/08/2019   ALBUMIN 4.3 02/08/2019   CALCIUM 9.0 02/08/2019   GFR 96.97 02/08/2019   Lab Results  Component Value Date   CHOL 210 (H) 02/08/2019   Lab Results  Component Value Date   HDL 47.90 02/08/2019   Lab Results  Component Value Date   LDLCALC 146 (H) 02/02/2018   Lab Results  Component Value Date   TRIG 212.0 (H) 02/08/2019   Lab Results  Component Value Date   CHOLHDL 4 02/08/2019   No results found for: HGBA1C    Assessment & Plan:   #1 Medicare Subsequent annual medical exam -pneumovax given -consider shingrix at pharmacy later this year. -continue with annual flu vaccine  -set up repeat colonoscopy -The natural history of prostate cancer and  ongoing controversy regarding screening and potential treatment outcomes of prostate cancer has been discussed with the patient. The meaning of a false positive PSA and a false negative PSA has been discussed. He indicates understanding of the limitations of this screening test and wishes to proceed with screening PSA testing.   #2 hypothyroidism -repeat TSH  #3 Dyslipidemia  Repeat lipids and hepatic. Continue Crestor 5 mg po qd  #4 hx of DVT- chronic coumadin use -check CBC  Meds ordered this encounter  Medications  . liothyronine (CYTOMEL) 25 MCG tablet    Sig: TAKE 1/2 TABLET (12.5 MCG TOTAL) BY MOUTH DAILY.    Dispense:  45 tablet    Refill:  3    Follow-up: No follow-ups on file.    Carolann Littler, MD

## 2020-02-22 NOTE — Patient Instructions (Signed)
Consider colonoscopy later this year  Consider Shingrix vaccine at some point this year.   Get home BP cuff and monitor.  If systolic BP (top number) is consistently > 140 be in touch.

## 2020-02-22 NOTE — Addendum Note (Signed)
Addended by: Marrion Coy on: 02/22/2020 11:06 AM   Modules accepted: Orders

## 2020-02-23 ENCOUNTER — Ambulatory Visit (INDEPENDENT_AMBULATORY_CARE_PROVIDER_SITE_OTHER): Payer: Medicare Other | Admitting: General Practice

## 2020-02-23 DIAGNOSIS — Z7901 Long term (current) use of anticoagulants: Secondary | ICD-10-CM | POA: Diagnosis not present

## 2020-02-23 NOTE — Progress Notes (Signed)
Medical screening examination/treatment/procedure(s) were performed by non-physician practitioner and as supervising physician I was immediately available for consultation/collaboration. I agree with above. Tilman Mcclaren, MD   

## 2020-02-23 NOTE — Patient Instructions (Signed)
Pre visit review using our clinic review tool, if applicable. No additional management support is needed unless otherwise documented below in the visit note.  Change dosage and take 1/2 daily except 1 tablet on Mondays.  Dosing instructions given to patient and he did verbalize understanding.  Re-check in 4 weeks.

## 2020-02-24 ENCOUNTER — Telehealth: Payer: Self-pay

## 2020-02-24 NOTE — Telephone Encounter (Signed)
-----   Message from Eulas Post, MD sent at 02/23/2020 10:57 AM EST ----- Labs all look good.  PSA is normal.   Lipids improved c/w last year.  Liver panel and electrolytes normal.

## 2020-02-24 NOTE — Telephone Encounter (Signed)
Patient informed of results and recommendations.  He also bought a blood pressure machine and has been monitoring it.  Will call if it stays elevated.

## 2020-03-13 ENCOUNTER — Other Ambulatory Visit: Payer: Self-pay | Admitting: Family Medicine

## 2020-03-13 NOTE — Telephone Encounter (Signed)
Last office visit- 02/22/2020 Last refill-  08/14/2019--60 tabs with five refills  No future appointment scheduled

## 2020-03-21 ENCOUNTER — Other Ambulatory Visit: Payer: Self-pay

## 2020-03-21 ENCOUNTER — Ambulatory Visit (INDEPENDENT_AMBULATORY_CARE_PROVIDER_SITE_OTHER): Payer: Medicare Other | Admitting: General Practice

## 2020-03-21 DIAGNOSIS — Z7901 Long term (current) use of anticoagulants: Secondary | ICD-10-CM | POA: Diagnosis not present

## 2020-03-21 LAB — POCT INR: INR: 2.6 (ref 2.0–3.0)

## 2020-03-21 NOTE — Patient Instructions (Addendum)
Pre visit review using our clinic review tool, if applicable. No additional management support is needed unless otherwise documented below in the visit note.  Continue to take 1/2 daily except 1 tablet on Mondays.  Re-check in 6 weeks.

## 2020-05-02 ENCOUNTER — Other Ambulatory Visit: Payer: Self-pay

## 2020-05-02 ENCOUNTER — Ambulatory Visit (INDEPENDENT_AMBULATORY_CARE_PROVIDER_SITE_OTHER): Payer: Medicare Other | Admitting: General Practice

## 2020-05-02 DIAGNOSIS — Z7901 Long term (current) use of anticoagulants: Secondary | ICD-10-CM | POA: Diagnosis not present

## 2020-05-02 LAB — POCT INR: INR: 1.9 — AB (ref 2.0–3.0)

## 2020-05-02 NOTE — Patient Instructions (Addendum)
Pre visit review using our clinic review tool, if applicable. No additional management support is needed unless otherwise documented below in the visit note.  Take extra 1/2 tablet today and then continue to take 1/2 daily except 1 tablet on Mondays.  Re-check in 6 weeks.

## 2020-06-06 ENCOUNTER — Ambulatory Visit (INDEPENDENT_AMBULATORY_CARE_PROVIDER_SITE_OTHER): Payer: Medicare Other | Admitting: General Practice

## 2020-06-06 ENCOUNTER — Other Ambulatory Visit: Payer: Self-pay

## 2020-06-06 VITALS — BP 128/60

## 2020-06-06 DIAGNOSIS — Z7901 Long term (current) use of anticoagulants: Secondary | ICD-10-CM

## 2020-06-06 LAB — POCT INR: INR: 2.8 (ref 2.0–3.0)

## 2020-06-06 NOTE — Patient Instructions (Addendum)
Pre visit review using our clinic review tool, if applicable. No additional management support is needed unless otherwise documented below in the visit note.  Continue to take 1/2 daily except 1 tablet on Mondays.  Re-check in 6 weeks.

## 2020-06-14 IMAGING — DX LEFT KNEE - COMPLETE 4+ VIEW
4 series · 4 of 4 positions shown · non-contrast
Comparison: None.

CLINICAL DATA: Left knee pain, progressive over the last year. No
known injury.

EXAM:
LEFT KNEE - COMPLETE 4+ VIEW

[knee ap (1 of 3)]
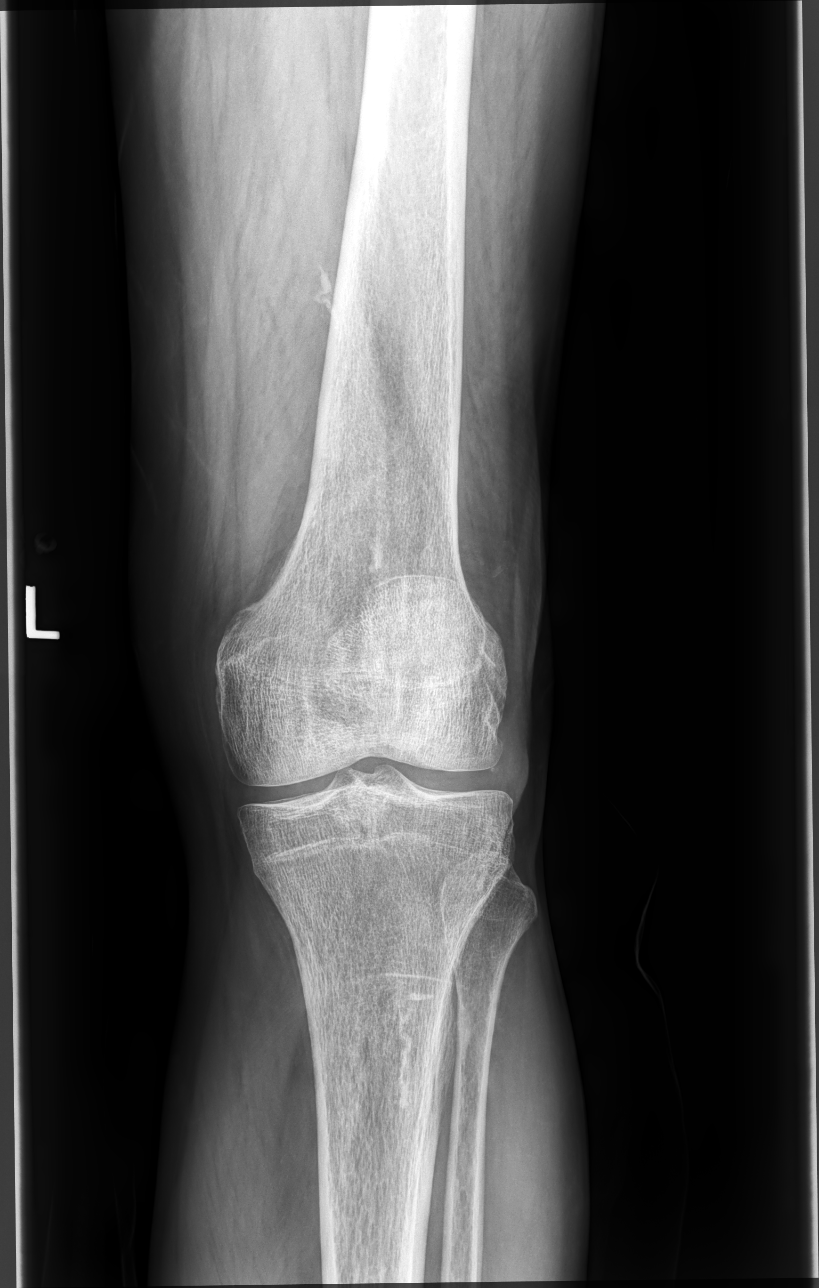

[knee ap (2 of 3)]
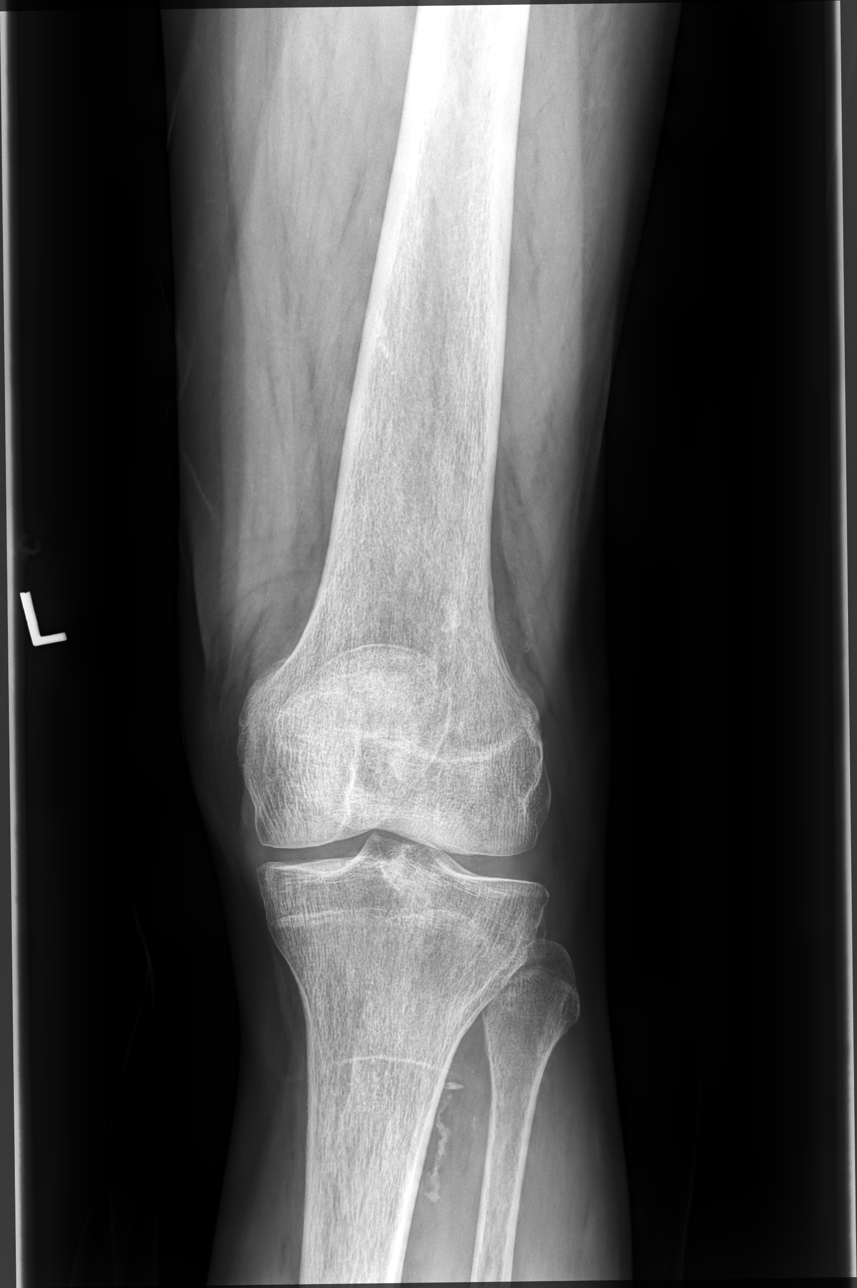

[knee ap (3 of 3)]
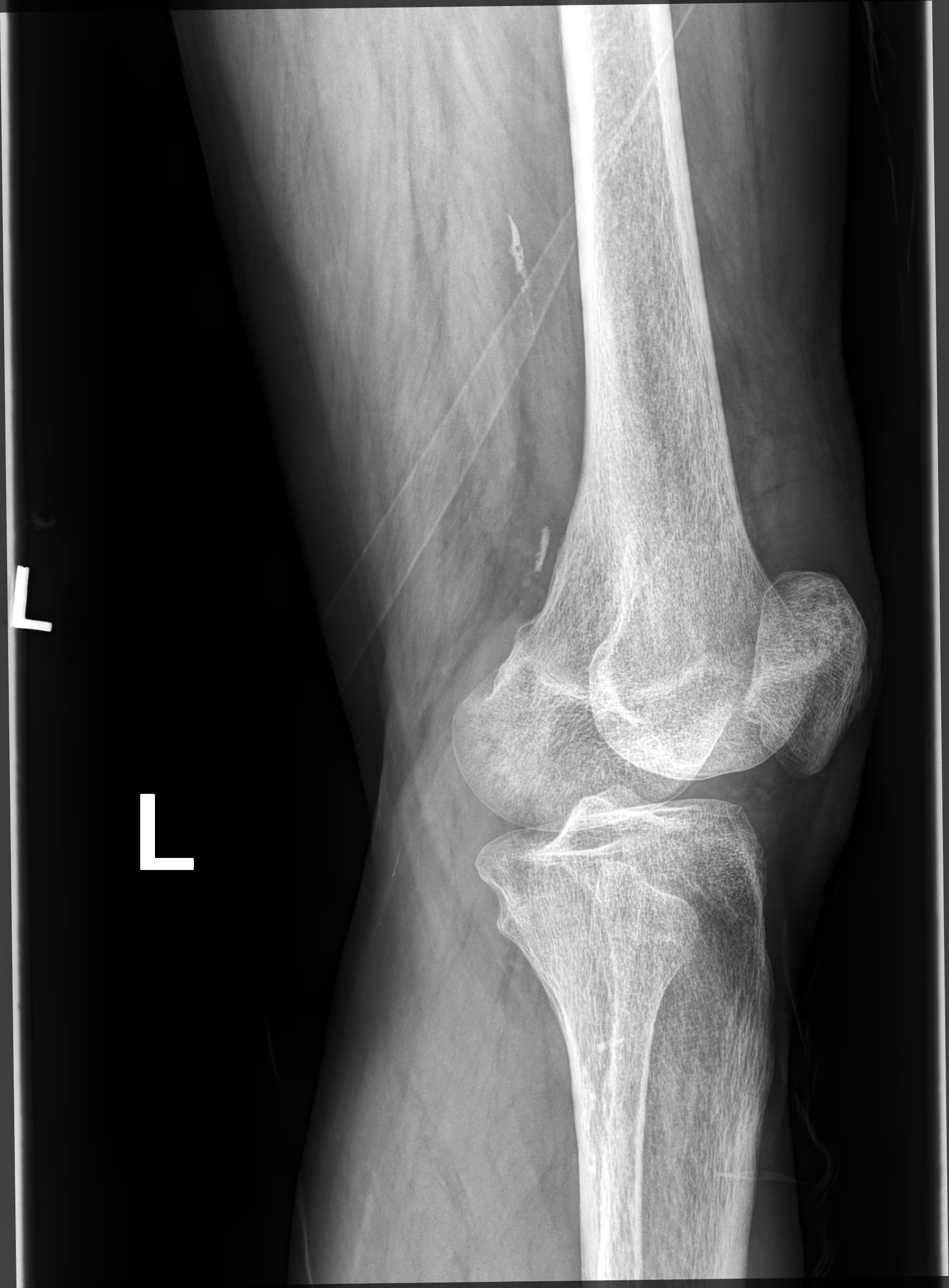

[knee lat]
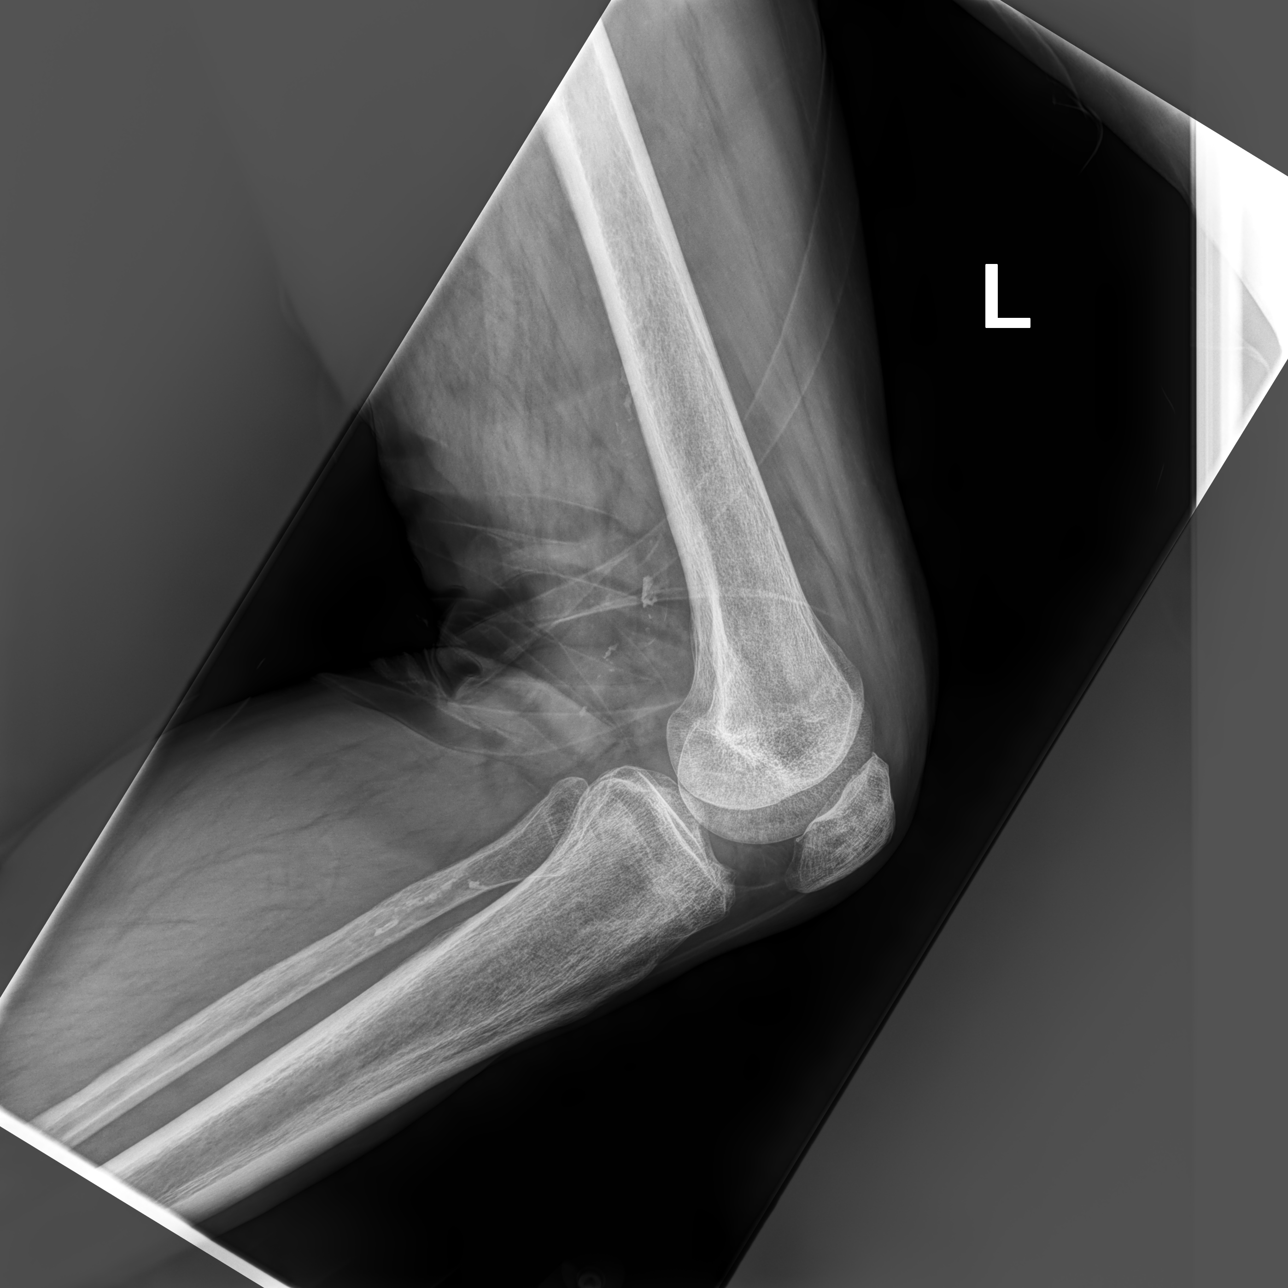

[4 of 4 positions shown; findings below may reference images not displayed]

FINDINGS: No fracture, subluxation, or bony destructive change. The alignment
is maintained. Minimal medial tibiofemoral joint space narrowing.
Tiny patellofemoral spurs. Small joint effusion. No osseous
erosions. There are vascular calcifications.
IMPRESSION: Minimal osteoarthritis. Small joint effusion. No acute bony
abnormality.

## 2020-07-16 ENCOUNTER — Other Ambulatory Visit: Payer: Self-pay | Admitting: Family Medicine

## 2020-07-16 DIAGNOSIS — Z7901 Long term (current) use of anticoagulants: Secondary | ICD-10-CM

## 2020-07-18 ENCOUNTER — Ambulatory Visit (INDEPENDENT_AMBULATORY_CARE_PROVIDER_SITE_OTHER): Payer: Medicare Other | Admitting: General Practice

## 2020-07-18 ENCOUNTER — Other Ambulatory Visit: Payer: Self-pay

## 2020-07-18 DIAGNOSIS — Z7901 Long term (current) use of anticoagulants: Secondary | ICD-10-CM | POA: Diagnosis not present

## 2020-07-18 LAB — POCT INR: INR: 2.3 (ref 2.0–3.0)

## 2020-07-18 NOTE — Patient Instructions (Addendum)
Pre visit review using our clinic review tool, if applicable. No additional management support is needed unless otherwise documented below in the visit note.  Continue to take 1/2 daily except 1 tablet on Mondays.  Re-check in 6 weeks.

## 2020-08-29 ENCOUNTER — Other Ambulatory Visit: Payer: Self-pay

## 2020-08-29 ENCOUNTER — Ambulatory Visit (INDEPENDENT_AMBULATORY_CARE_PROVIDER_SITE_OTHER): Payer: Medicare Other | Admitting: General Practice

## 2020-08-29 DIAGNOSIS — Z7901 Long term (current) use of anticoagulants: Secondary | ICD-10-CM | POA: Diagnosis not present

## 2020-08-29 LAB — POCT INR: INR: 2.5 (ref 2.0–3.0)

## 2020-08-29 NOTE — Patient Instructions (Signed)
Pre visit review using our clinic review tool, if applicable. No additional management support is needed unless otherwise documented below in the visit note. Continue to take 1/2 daily except 1 tablet on Mondays.  Re-check in 6 weeks.

## 2020-10-10 ENCOUNTER — Ambulatory Visit (INDEPENDENT_AMBULATORY_CARE_PROVIDER_SITE_OTHER): Payer: Medicare Other

## 2020-10-10 ENCOUNTER — Other Ambulatory Visit: Payer: Self-pay

## 2020-10-10 DIAGNOSIS — Z7901 Long term (current) use of anticoagulants: Secondary | ICD-10-CM

## 2020-10-10 LAB — POCT INR: INR: 3.2 — AB (ref 2.0–3.0)

## 2020-10-10 NOTE — Patient Instructions (Addendum)
Pre visit review using our clinic review tool, if applicable. No additional management support is needed unless otherwise documented below in the visit note.  Hold dose tomorrow and then continue to take 1/2 daily except 1 tablet on Mondays.  Re-check in 6 weeks.

## 2020-10-11 ENCOUNTER — Other Ambulatory Visit: Payer: Self-pay | Admitting: Family Medicine

## 2020-10-12 NOTE — Telephone Encounter (Signed)
Last filled 03/13/2020 Last OV 02/22/2020  Ok to fill?

## 2020-11-19 ENCOUNTER — Encounter: Payer: Self-pay | Admitting: Family Medicine

## 2020-11-20 ENCOUNTER — Other Ambulatory Visit: Payer: Self-pay

## 2020-11-21 ENCOUNTER — Ambulatory Visit (INDEPENDENT_AMBULATORY_CARE_PROVIDER_SITE_OTHER): Payer: Medicare Other

## 2020-11-21 DIAGNOSIS — Z7901 Long term (current) use of anticoagulants: Secondary | ICD-10-CM

## 2020-11-21 LAB — POCT INR: INR: 2.9 (ref 2.0–3.0)

## 2020-11-21 NOTE — Patient Instructions (Addendum)
Pre visit review using our clinic review tool, if applicable. No additional management support is needed unless otherwise documented below in the visit note.  Continue to take 1/2 daily except 1 tablet on Mondays.  Re-check in 6 weeks.

## 2020-11-27 ENCOUNTER — Other Ambulatory Visit: Payer: Self-pay | Admitting: Family Medicine

## 2020-11-27 NOTE — Telephone Encounter (Signed)
Last filled 10/12/2020 Last OV 02/22/2020  Ok to fill?

## 2021-01-02 ENCOUNTER — Ambulatory Visit (INDEPENDENT_AMBULATORY_CARE_PROVIDER_SITE_OTHER): Payer: Medicare Other

## 2021-01-02 DIAGNOSIS — Z7901 Long term (current) use of anticoagulants: Secondary | ICD-10-CM | POA: Diagnosis not present

## 2021-01-02 LAB — POCT INR: INR: 3.3 — AB (ref 2.0–3.0)

## 2021-01-02 MED ORDER — WARFARIN SODIUM 2.5 MG PO TABS: ORAL_TABLET | ORAL | 1 refills | Status: DC

## 2021-01-02 NOTE — Progress Notes (Addendum)
Hold dose tomorrow and then continue to take 1/2 daily except 1 tablet on Mondays.  Re-check in 5 weeks

## 2021-01-02 NOTE — Patient Instructions (Addendum)
Pre visit review using our clinic review tool, if applicable. No additional management support is needed unless otherwise documented below in the visit note.  Hold dose tomorrow and then continue to take 1/2 daily except 1 tablet on Mondays.  Re-check in 5 weeks

## 2021-01-15 ENCOUNTER — Other Ambulatory Visit: Payer: Self-pay | Admitting: Family Medicine

## 2021-01-15 NOTE — Telephone Encounter (Signed)
Last filled 11/27/2020 Last OV 02/22/2020  Ok to fill tramadol?

## 2021-01-15 NOTE — Telephone Encounter (Signed)
Refilled both medications once.  His last office visit was February 22, 2020.  Needs follow-up sometime early 2023 prior to further refills.

## 2021-01-30 ENCOUNTER — Other Ambulatory Visit: Payer: Self-pay | Admitting: Family Medicine

## 2021-01-30 DIAGNOSIS — E039 Hypothyroidism, unspecified: Secondary | ICD-10-CM

## 2021-02-06 ENCOUNTER — Ambulatory Visit (INDEPENDENT_AMBULATORY_CARE_PROVIDER_SITE_OTHER): Payer: Medicare Other

## 2021-02-06 DIAGNOSIS — Z7901 Long term (current) use of anticoagulants: Secondary | ICD-10-CM

## 2021-02-06 LAB — POCT INR: INR: 2.2 (ref 2.0–3.0)

## 2021-02-06 NOTE — Progress Notes (Addendum)
Continue to take 1 tablet daily except take 2 tablets on Mondays.  Re-check in 6 weeks.

## 2021-02-06 NOTE — Patient Instructions (Addendum)
Pre visit review using our clinic review tool, if applicable. No additional management support is needed unless otherwise documented below in the visit note.  Continue to take 1 tablet daily except take 2 tablets on Mondays.  Re-check in 6 weeks.

## 2021-02-13 ENCOUNTER — Ambulatory Visit: Payer: Medicare Other

## 2021-02-14 ENCOUNTER — Other Ambulatory Visit: Payer: Self-pay | Admitting: Family Medicine

## 2021-02-14 DIAGNOSIS — E039 Hypothyroidism, unspecified: Secondary | ICD-10-CM

## 2021-02-22 ENCOUNTER — Ambulatory Visit (INDEPENDENT_AMBULATORY_CARE_PROVIDER_SITE_OTHER): Payer: Medicare Other | Admitting: Family Medicine

## 2021-02-22 VITALS — BP 148/80 | HR 90 | Temp 98.8°F | Wt 174.8 lb

## 2021-02-22 DIAGNOSIS — I10 Essential (primary) hypertension: Secondary | ICD-10-CM | POA: Insufficient documentation

## 2021-02-22 DIAGNOSIS — Z7901 Long term (current) use of anticoagulants: Secondary | ICD-10-CM | POA: Diagnosis not present

## 2021-02-22 DIAGNOSIS — Z1211 Encounter for screening for malignant neoplasm of colon: Secondary | ICD-10-CM | POA: Diagnosis not present

## 2021-02-22 DIAGNOSIS — E039 Hypothyroidism, unspecified: Secondary | ICD-10-CM

## 2021-02-22 DIAGNOSIS — E785 Hyperlipidemia, unspecified: Secondary | ICD-10-CM | POA: Diagnosis not present

## 2021-02-22 DIAGNOSIS — Z125 Encounter for screening for malignant neoplasm of prostate: Secondary | ICD-10-CM

## 2021-02-22 LAB — CBC WITH DIFFERENTIAL/PLATELET
Basophils Absolute: 0.1 10*3/uL (ref 0.0–0.1)
Basophils Relative: 1.3 % (ref 0.0–3.0)
Eosinophils Absolute: 0.3 10*3/uL (ref 0.0–0.7)
Eosinophils Relative: 3.6 % (ref 0.0–5.0)
HCT: 44.1 % (ref 39.0–52.0)
Hemoglobin: 14.6 g/dL (ref 13.0–17.0)
Lymphocytes Relative: 27.5 % (ref 12.0–46.0)
Lymphs Abs: 2 10*3/uL (ref 0.7–4.0)
MCHC: 33.1 g/dL (ref 30.0–36.0)
MCV: 95.7 fl (ref 78.0–100.0)
Monocytes Absolute: 0.8 10*3/uL (ref 0.1–1.0)
Monocytes Relative: 10.7 % (ref 3.0–12.0)
Neutro Abs: 4 10*3/uL (ref 1.4–7.7)
Neutrophils Relative %: 56.9 % (ref 43.0–77.0)
Platelets: 312 10*3/uL (ref 150.0–400.0)
RBC: 4.6 Mil/uL (ref 4.22–5.81)
RDW: 13.2 % (ref 11.5–15.5)
WBC: 7.1 10*3/uL (ref 4.0–10.5)

## 2021-02-22 LAB — BASIC METABOLIC PANEL
BUN: 12 mg/dL (ref 6–23)
CO2: 30 mEq/L (ref 19–32)
Calcium: 9.6 mg/dL (ref 8.4–10.5)
Chloride: 102 mEq/L (ref 96–112)
Creatinine, Ser: 0.97 mg/dL (ref 0.40–1.50)
GFR: 80.96 mL/min (ref >60.00)
Glucose, Bld: 92 mg/dL (ref 70–99)
Potassium: 4.7 mEq/L (ref 3.5–5.1)
Sodium: 139 mEq/L (ref 135–145)

## 2021-02-22 LAB — LIPID PANEL
Cholesterol: 200 mg/dL (ref 0–200)
HDL: 63.3 mg/dL (ref >39.00)
LDL Cholesterol: 111 mg/dL — ABNORMAL HIGH (ref 0–99)
NonHDL: 136.41
Total CHOL/HDL Ratio: 3
Triglycerides: 128 mg/dL (ref 0.0–149.0)
VLDL: 25.6 mg/dL (ref 0.0–40.0)

## 2021-02-22 LAB — HEPATIC FUNCTION PANEL
ALT: 18 U/L (ref 0–53)
AST: 21 U/L (ref 0–37)
Albumin: 4.6 g/dL (ref 3.5–5.2)
Alkaline Phosphatase: 69 U/L (ref 39–117)
Bilirubin, Direct: 0.1 mg/dL (ref 0.0–0.3)
Total Bilirubin: 0.7 mg/dL (ref 0.2–1.2)
Total Protein: 7.3 g/dL (ref 6.0–8.3)

## 2021-02-22 LAB — PSA, MEDICARE: PSA: 1.05 ng/ml (ref 0.10–4.00)

## 2021-02-22 LAB — TSH: TSH: 2.19 u[IU]/mL (ref 0.35–5.50)

## 2021-02-22 MED ORDER — ROSUVASTATIN CALCIUM 5 MG PO TABS: ORAL_TABLET | ORAL | 3 refills | Status: DC

## 2021-02-22 MED ORDER — LOSARTAN POTASSIUM 50 MG PO TABS: 50.0000 mg | ORAL_TABLET | Freq: Every day | ORAL | 3 refills | Status: DC

## 2021-02-22 MED ORDER — LEVOTHYROXINE SODIUM 88 MCG PO TABS: 88.0000 ug | ORAL_TABLET | Freq: Every day | ORAL | 3 refills | Status: DC

## 2021-02-22 MED ORDER — TRAMADOL HCL 50 MG PO TABS: 50.0000 mg | ORAL_TABLET | Freq: Two times a day (BID) | ORAL | 0 refills | Status: DC | PRN

## 2021-02-22 MED ORDER — LIOTHYRONINE SODIUM 25 MCG PO TABS: ORAL_TABLET | ORAL | 3 refills | Status: DC

## 2021-02-22 MED ORDER — SERTRALINE HCL 50 MG PO TABS: 50.0000 mg | ORAL_TABLET | Freq: Every day | ORAL | 3 refills | Status: DC

## 2021-02-22 NOTE — Progress Notes (Signed)
Established Patient Office Visit  Subjective:  Patient ID: Tyler Atkinson, male    DOB: 10-Jun-1955  Age: 66 y.o. MRN: [id number]  CC:  Chief Complaint  Patient presents with   Annual Exam    HPI [name] presents for annual medical follow-up.  He has history of DVT left upper extremity, hypothyroidism, chronic Coumadin therapy, history of gout Tate psoriasis, dyslipidemia.  He brings in a log of blood pressure readings and has had fairly consistent readings over 638 systolic.  Some 120s and 130s with a high of 937 with diastolics 34K to 87G.  Never treated for hypertension previously.  He has hypothyroidism and is on replacement.  Due for follow-up labs.  Takes rosuvastatin low dosage.  Osteoarthritis involving multiple joints.  He takes low-dose tramadol as needed.  Requesting refills of several medications.  Health maintenance reviewed.  He is overdue for colonoscopy and would like to get that set up.  He is requesting PSA.  Flu vaccine already given  Past Medical History:  Diagnosis Date   Depression    DVT of upper extremity (deep vein thrombosis) ([place]) 3/11   Hay fever    allergies   Hyperlipidemia    Hypothyroidism    Thyroid cancer [facility])     Past Surgical History:  Procedure Laterality Date   COLONOSCOPY     01/27/2006. Normal   THYROIDECTOMY     2000    Family History  Problem Relation Age of Onset   Colon cancer Other        blood relative   Breast cancer Other        Grandparent   Heart disease Other        blood relative   Hypertension Other    Stroke Other    Diabetes Other    Hypertension Mother    Alzheimer's disease Mother     Social History   Socioeconomic History   Marital status: Married    Spouse name: Not on file   Number of children: Not on file   Years of education: Not on file   Highest education level: Not on file  Occupational History   Occupation: Retired Dispensing optician  Tobacco Use   Smoking status: Former     Types: Cigarettes    Quit date: 01/27/1985    Years since quitting: 36.0   Smokeless tobacco: Former  Scientific laboratory technician Use: Never used  Substance and Sexual Activity   Alcohol use: Yes   Drug use: No   Sexual activity: Not on file  Other Topics Concern   Not on file  Social History Narrative   Not on file   Social Determinants of Health   Financial Resource Strain: Not on file  Food Insecurity: Not on file  Transportation Needs: Not on file  Physical Activity: Not on file  Stress: Not on file  Social Connections: Not on file  Intimate Partner Violence: Not on file    Outpatient Medications Prior to Visit  Medication Sig Dispense Refill   desonide (DESOWEN) 0.05 % cream Apply topically 2 (two) times daily. 60 g 2   fexofenadine-pseudoephedrine (ALLEGRA-D) 60-120 MG per tablet Take 1 tablet by mouth daily as needed.     tadalafil (CIALIS) 20 MG tablet ONE BY MOUTH EVERY OTHER DAY AS NEEDED 6 tablet 6   warfarin (COUMADIN) 2.5 MG tablet TAKE 1 TABLET BY MOUTH DAILY EXCEPT TAKE 2 TABLETS ON MONDAYS OR AS DIRECTED BY ANTICOAGULATION CLINIC 100 tablet  1   levothyroxine (SYNTHROID) 88 MCG tablet TAKE 1 TABLET BY MOUTH EVERY DAY 90 tablet 0   liothyronine (CYTOMEL) 25 MCG tablet TAKE 1/2 TABLET (12.5 MCG TOTAL) BY MOUTH DAILY. 45 tablet 3   rosuvastatin (CRESTOR) 5 MG tablet TAKE 1 TABLET BY MOUTH EVERY DAY 90 tablet 3   sertraline (ZOLOFT) 50 MG tablet TAKE 1 TABLET BY MOUTH EVERY DAY 90 tablet 0   traMADol (ULTRAM) 50 MG tablet TAKE 1 TABLET BY MOUTH EVERY 12 HOURS AS NEEDED 60 tablet 0   No facility-administered medications prior to visit.    Allergies  Allergen Reactions   Ezetimibe-Simvastatin     REACTION: rash X 2-3 months    ROS Review of Systems  Constitutional:  Negative for fatigue and unexpected weight change.  Eyes:  Negative for visual disturbance.  Respiratory:  Negative for cough, chest tightness and shortness of breath.   Cardiovascular:  Negative for  chest pain, palpitations and leg swelling.  Endocrine: Negative for polydipsia and polyuria.  Genitourinary:  Negative for dysuria.  Musculoskeletal:  Positive for arthralgias.  Neurological:  Negative for dizziness, syncope, weakness, light-headedness and headaches.     Objective:    Physical Exam Constitutional:      Appearance: He is well-developed.  HENT:     Right Ear: External ear normal.     Left Ear: External ear normal.  Eyes:     Pupils: Pupils are equal, round, and reactive to light.  Neck:     Thyroid: No thyromegaly.  Cardiovascular:     Rate and Rhythm: Normal rate and regular rhythm.  Pulmonary:     Effort: Pulmonary effort is normal. No respiratory distress.     Breath sounds: Normal breath sounds. No wheezing or rales.  Abdominal:     Palpations: Abdomen is soft. There is no mass.     Tenderness: There is no abdominal tenderness.  Musculoskeletal:     Cervical back: Neck supple.     Right lower leg: No edema.     Left lower leg: No edema.  Neurological:     Mental Status: He is alert and oriented to person, place, and time.  Psychiatric:        Mood and Affect: Mood normal.        Thought Content: Thought content normal.    BP (!) 148/80 (BP Location: Left Arm, Cuff Size: Normal)    Pulse 90    Temp 98.8 F (37.1 C) (Oral)    Wt 174 lb 12.8 oz (79.3 kg)    SpO2 99%    BMI 25.81 kg/m  Wt Readings from Last 3 Encounters:  02/22/21 174 lb 12.8 oz (79.3 kg)  02/22/20 174 lb (78.9 kg)  02/08/19 171 lb 14.4 oz (78 kg)     Health Maintenance Due  Topic Date Due   Zoster Vaccines- Shingrix (1 of 2) Never done   COLONOSCOPY (Pts 45-56yrs Insurance coverage will need to be confirmed)  07/14/2016    There are no preventive care reminders to display for this patient.  Lab Results  Component Value Date   TSH 0.40 02/22/2020   Lab Results  Component Value Date   WBC 6.8 02/22/2020   HGB 14.5 02/22/2020   HCT 42.8 02/22/2020   MCV 92.4 02/22/2020    PLT 272.0 02/22/2020   Lab Results  Component Value Date   NA 139 02/22/2020   K 4.3 02/22/2020   CO2 28 02/22/2020   GLUCOSE 96 02/22/2020  BUN 13 02/22/2020   CREATININE 0.81 02/22/2020   BILITOT 0.8 02/22/2020   ALKPHOS 78 02/22/2020   AST 21 02/22/2020   ALT 21 02/22/2020   PROT 7.4 02/22/2020   ALBUMIN 4.6 02/22/2020   CALCIUM 9.5 02/22/2020   GFR 92.08 02/22/2020   Lab Results  Component Value Date   CHOL 182 02/22/2020   Lab Results  Component Value Date   HDL 75.30 02/22/2020   Lab Results  Component Value Date   LDLCALC 91 02/22/2020   Lab Results  Component Value Date   TRIG 76.0 02/22/2020   Lab Results  Component Value Date   CHOLHDL 2 02/22/2020   No results found for: HGBA1C    Assessment & Plan:   Problem List Items Addressed This Visit       Unprioritized   Chronic anticoagulation   Relevant Orders   CBC with Differential/Platelet   Dyslipidemia   Relevant Medications   rosuvastatin (CRESTOR) 5 MG tablet   Other Relevant Orders   Lipid panel   Basic metabolic panel   Hepatic function panel   Hypothyroidism   Relevant Medications   levothyroxine (SYNTHROID) 88 MCG tablet   liothyronine (CYTOMEL) 25 MCG tablet   Other Relevant Orders   TSH   Other Visit Diagnoses     Colon cancer screening    -  Primary   Relevant Orders   Ambulatory referral to Gastroenterology   Prostate cancer screening       Relevant Orders   PSA, Medicare     -Obtain labs as above  -Set up repeat colonoscopy.  Patient has had multiple elevated blood pressures at home.  This is confirmed here today.  We recommend starting losartan 50 mg once daily.  He will continue to monitor closely and get blood pressure checked with follow-up Coumadin check.  He will be in touch if he is not consistently seeing less than 140/90 over the next few weeks  Meds ordered this encounter  Medications   losartan (COZAAR) 50 MG tablet    Sig: Take 1 tablet (50 mg total)  by mouth daily.    Dispense:  90 tablet    Refill:  3   levothyroxine (SYNTHROID) 88 MCG tablet    Sig: Take 1 tablet (88 mcg total) by mouth daily.    Dispense:  90 tablet    Refill:  3   liothyronine (CYTOMEL) 25 MCG tablet    Sig: TAKE 1/2 TABLET (12.5 MCG TOTAL) BY MOUTH DAILY.    Dispense:  45 tablet    Refill:  3   rosuvastatin (CRESTOR) 5 MG tablet    Sig: TAKE 1 TABLET BY MOUTH EVERY DAY    Dispense:  90 tablet    Refill:  3   sertraline (ZOLOFT) 50 MG tablet    Sig: Take 1 tablet (50 mg total) by mouth daily.    Dispense:  90 tablet    Refill:  3   traMADol (ULTRAM) 50 MG tablet    Sig: Take 1 tablet (50 mg total) by mouth every 12 (twelve) hours as needed.    Dispense:  60 tablet    Refill:  0    Not to exceed 4 additional fills before 05/26/2021    Follow-up: No follow-ups on file.    Carolann Littler, MD

## 2021-03-20 ENCOUNTER — Ambulatory Visit (INDEPENDENT_AMBULATORY_CARE_PROVIDER_SITE_OTHER): Payer: Medicare Other

## 2021-03-20 DIAGNOSIS — Z7901 Long term (current) use of anticoagulants: Secondary | ICD-10-CM | POA: Diagnosis not present

## 2021-03-20 LAB — POCT INR: INR: 1.9 — AB (ref 2.0–3.0)

## 2021-03-20 NOTE — Progress Notes (Addendum)
Increase dose today to take 1 1/2 tablets today and then continue to take 1 tablet daily except take 2 tablets on Mondays.  Re-check in 6 weeks per pt request

## 2021-03-20 NOTE — Patient Instructions (Addendum)
Pre visit review using our clinic review tool, if applicable. No additional management support is needed unless otherwise documented below in the visit note.  Increase dose today to take 1 1/2 tablets today and then continue to take 1 tablet daily except take 2 tablets on Mondays.  Re-check in 6 weeks

## 2021-04-09 ENCOUNTER — Other Ambulatory Visit: Payer: Self-pay | Admitting: Family Medicine

## 2021-04-22 ENCOUNTER — Ambulatory Visit (INDEPENDENT_AMBULATORY_CARE_PROVIDER_SITE_OTHER): Payer: Medicare Other

## 2021-04-22 VITALS — Ht 71.0 in | Wt 175.0 lb

## 2021-04-22 DIAGNOSIS — Z0000 Encounter for general adult medical examination without abnormal findings: Secondary | ICD-10-CM | POA: Diagnosis not present

## 2021-04-22 NOTE — Patient Instructions (Signed)
Mr. Paulino , ?Thank you for taking time to come for your Medicare Wellness Visit. I appreciate your ongoing commitment to your health goals. Please review the following plan we discussed and let me know if I can assist you in the future.  ? ?Screening recommendations/referrals: ?Colonoscopy: due ?Recommended yearly ophthalmology/optometry visit for glaucoma screening and checkup ?Recommended yearly dental visit for hygiene and checkup ? ?Vaccinations: ?Influenza vaccine: completed 11/13/2020, due next flu season ?Pneumococcal vaccine: completed 02/22/2020 ?Tdap vaccine: completed 01/23/2016, due 01/22/2026 ?Shingles vaccine: discussed   ?Covid-19:  11/13/2020, 11/23/2019, 04/27/2019 ? ?Advanced directives: Advance directive discussed with you today. . ? ?Conditions/risks identified: none ? ?Next appointment: Follow up in one year for your annual wellness visit.  ? ?Preventive Care 68 Years and Older, Male ?Preventive care refers to lifestyle choices and visits with your health care provider that can promote health and wellness. ?What does preventive care include? ?A yearly physical exam. This is also called an annual well check. ?Dental exams once or twice a year. ?Routine eye exams. Ask your health care provider how often you should have your eyes checked. ?Personal lifestyle choices, including: ?Daily care of your teeth and gums. ?Regular physical activity. ?Eating a healthy diet. ?Avoiding tobacco and drug use. ?Limiting alcohol use. ?Practicing safe sex. ?Taking low doses of aspirin every day. ?Taking vitamin and mineral supplements as recommended by your health care provider. ?What happens during an annual well check? ?The services and screenings done by your health care provider during your annual well check will depend on your age, overall health, lifestyle risk factors, and family history of disease. ?Counseling  ?Your health care provider may ask you questions about your: ?Alcohol use. ?Tobacco use. ?Drug  use. ?Emotional well-being. ?Home and relationship well-being. ?Sexual activity. ?Eating habits. ?History of falls. ?Memory and ability to understand (cognition). ?Work and work Statistician. ?Screening  ?You may have the following tests or measurements: ?Height, weight, and BMI. ?Blood pressure. ?Lipid and cholesterol levels. These may be checked every 5 years, or more frequently if you are over 45 years old. ?Skin check. ?Lung cancer screening. You may have this screening every year starting at age 35 if you have a 30-pack-year history of smoking and currently smoke or have quit within the past 15 years. ?Fecal occult blood test (FOBT) of the stool. You may have this test every year starting at age 68. ?Flexible sigmoidoscopy or colonoscopy. You may have a sigmoidoscopy every 5 years or a colonoscopy every 10 years starting at age 54. ?Prostate cancer screening. Recommendations will vary depending on your family history and other risks. ?Hepatitis C blood test. ?Hepatitis B blood test. ?Sexually transmitted disease (STD) testing. ?Diabetes screening. This is done by checking your blood sugar (glucose) after you have not eaten for a while (fasting). You may have this done every 1-3 years. ?Abdominal aortic aneurysm (AAA) screening. You may need this if you are a current or former smoker. ?Osteoporosis. You may be screened starting at age 20 if you are at high risk. ?Talk with your health care provider about your test results, treatment options, and if necessary, the need for more tests. ?Vaccines  ?Your health care provider may recommend certain vaccines, such as: ?Influenza vaccine. This is recommended every year. ?Tetanus, diphtheria, and acellular pertussis (Tdap, Td) vaccine. You may need a Td booster every 10 years. ?Zoster vaccine. You may need this after age 75. ?Pneumococcal 13-valent conjugate (PCV13) vaccine. One dose is recommended after age 89. ?Pneumococcal polysaccharide (PPSV23) vaccine. One  dose is  recommended after age 52. ?Talk to your health care provider about which screenings and vaccines you need and how often you need them. ?This information is not intended to replace advice given to you by your health care provider. Make sure you discuss any questions you have with your health care provider. ?Document Released: 02/09/2015 Document Revised: 10/03/2015 Document Reviewed: 11/14/2014 ?Elsevier Interactive Patient Education ? 2017 [place]. ? ?Fall Prevention in the Home ?Falls can cause injuries. They can happen to people of all ages. There are many things you can do to make your home safe and to help prevent falls. ?What can I do on the outside of my home? ?Regularly fix the edges of walkways and driveways and fix any cracks. ?Remove anything that might make you trip as you walk through a door, such as a raised step or threshold. ?Trim any bushes or trees on the path to your home. ?Use bright outdoor lighting. ?Clear any walking paths of anything that might make someone trip, such as rocks or tools. ?Regularly check to see if handrails are loose or broken. Make sure that both sides of any steps have handrails. ?Any raised decks and porches should have guardrails on the edges. ?Have any leaves, snow, or ice cleared regularly. ?Use sand or salt on walking paths during winter. ?Clean up any spills in your garage right away. This includes oil or grease spills. ?What can I do in the bathroom? ?Use night lights. ?Install grab bars by the toilet and in the tub and shower. Do not use towel bars as grab bars. ?Use non-skid mats or decals in the tub or shower. ?If you need to sit down in the shower, use a plastic, non-slip stool. ?Keep the floor dry. Clean up any water that spills on the floor as soon as it happens. ?Remove soap buildup in the tub or shower regularly. ?Attach bath mats securely with double-sided non-slip rug tape. ?Do not have throw rugs and other things on the floor that can make you  trip. ?What can I do in the bedroom? ?Use night lights. ?Make sure that you have a light by your bed that is easy to reach. ?Do not use any sheets or blankets that are too big for your bed. They should not hang down onto the floor. ?Have a firm chair that has side arms. You can use this for support while you get dressed. ?Do not have throw rugs and other things on the floor that can make you trip. ?What can I do in the kitchen? ?Clean up any spills right away. ?Avoid walking on wet floors. ?Keep items that you use a lot in easy-to-reach places. ?If you need to reach something above you, use a strong step stool that has a grab bar. ?Keep electrical cords out of the way. ?Do not use floor polish or wax that makes floors slippery. If you must use wax, use non-skid floor wax. ?Do not have throw rugs and other things on the floor that can make you trip. ?What can I do with my stairs? ?Do not leave any items on the stairs. ?Make sure that there are handrails on both sides of the stairs and use them. Fix handrails that are broken or loose. Make sure that handrails are as long as the stairways. ?Check any carpeting to make sure that it is firmly attached to the stairs. Fix any carpet that is loose or worn. ?Avoid having throw rugs at the top or bottom of the  stairs. If you do have throw rugs, attach them to the floor with carpet tape. ?Make sure that you have a light switch at the top of the stairs and the bottom of the stairs. If you do not have them, ask someone to add them for you. ?What else can I do to help prevent falls? ?Wear shoes that: ?Do not have high heels. ?Have rubber bottoms. ?Are comfortable and fit you well. ?Are closed at the toe. Do not wear sandals. ?If you use a stepladder: ?Make sure that it is fully opened. Do not climb a closed stepladder. ?Make sure that both sides of the stepladder are locked into place. ?Ask someone to hold it for you, if possible. ?Clearly mark and make sure that you can  see: ?Any grab bars or handrails. ?First and last steps. ?Where the edge of each step is. ?Use tools that help you move around (mobility aids) if they are needed. These include: ?Canes. ?Walkers. ?Scooters. ?Crutches. ?Tu

## 2021-04-22 NOTE — Progress Notes (Signed)
?I connected with Tyler Atkinson today by telephone and verified that I am speaking with the correct person using two identifiers. ?Location patient: home ?Location provider: work ?Persons participating in the virtual visit: Alberto, Pina LPN. ?  ?I discussed the limitations, risks, security and privacy concerns of performing an evaluation and management service by telephone and the availability of in person appointments. I also discussed with the patient that there may be a patient responsible charge related to this service. The patient expressed understanding and verbally consented to this telephonic visit.  ?  ?Interactive audio and video telecommunications were attempted between this provider and patient, however failed, due to patient having technical difficulties OR patient did not have access to video capability.  We continued and completed visit with audio only. ? ?  ? ?Vital signs may be patient reported or missing. ? ?Subjective:  ? Tyler Atkinson is a 65 y.o. male who presents for Medicare Annual/Subsequent preventive examination. ? ?Review of Systems    ? ?Cardiac Risk Factors include: advanced age (>54mn, >>82women);dyslipidemia;hypertension;male gender ? ?   ?Objective:  ?  ?Today's Vitals  ? 04/22/21 0942 04/22/21 0943  ?Weight: 175 lb (79.4 kg)   ?Height: '5\' 11"'$  (1.803 m)   ?PainSc:  5   ? ?Body mass index is 24.41 kg/m?. ? ? ?  04/22/2021  ?  9:52 AM  ?Advanced Directives  ?Does Patient Have a Medical Advance Directive? No  ? ? ?Current Medications (verified) ?Outpatient Encounter Medications as of 04/22/2021  ?Medication Sig  ? desonide (DESOWEN) 0.05 % cream Apply topically 2 (two) times daily.  ? fexofenadine-pseudoephedrine (ALLEGRA-D) 60-120 MG per tablet Take 1 tablet by mouth daily as needed.  ? levothyroxine (SYNTHROID) 88 MCG tablet Take 1 tablet (88 mcg total) by mouth daily.  ? liothyronine (CYTOMEL) 25 MCG tablet TAKE 1/2 TABLET (12.5 MCG TOTAL) BY MOUTH DAILY.  ? losartan  (COZAAR) 50 MG tablet Take 1 tablet (50 mg total) by mouth daily.  ? rosuvastatin (CRESTOR) 5 MG tablet TAKE 1 TABLET BY MOUTH EVERY DAY  ? sertraline (ZOLOFT) 50 MG tablet Take 1 tablet (50 mg total) by mouth daily.  ? tadalafil (CIALIS) 20 MG tablet ONE BY MOUTH EVERY OTHER DAY AS NEEDED  ? traMADol (ULTRAM) 50 MG tablet TAKE 1 TABLET BY MOUTH EVERY 12 HOURS AS NEEDED.  ? warfarin (COUMADIN) 2.5 MG tablet TAKE 1 TABLET BY MOUTH DAILY EXCEPT TAKE 2 TABLETS ON MONDAYS OR AS DIRECTED BY ANTICOAGULATION CLINIC  ? ?No facility-administered encounter medications on file as of 04/22/2021.  ? ? ?Allergies (verified) ?Ezetimibe-simvastatin  ? ?History: ?Past Medical History:  ?Diagnosis Date  ? Depression   ? D[place] of upper [place] (deep vein thrombosis) [place]  ? Hay fever   ? allergies  ? Hyperlipidemia   ? Hypothyroidism   ? [place]   ? ?Past Surgical History:  ?Procedure Laterality Date  ? COLONOSCOPY    ? 01/27/2006. Normal  ? THYROIDECTOMY    ? 2000  ? ?Family History  ?Problem Relation Age of Onset  ? Colon cancer Other   ?     blood relative  ? Breast cancer Other   ?     Grandparent  ? Heart disease Other   ?     blood relative  ? Hypertension Other   ? Stroke Other   ? Diabetes Other   ? Hypertension Mother   ? Alzheimer's disease Mother   ? ?Social History  ? ?  Socioeconomic History  ? Marital status: Married  ?  Spouse name: Not on file  ? Number of children: Not on file  ? Years of education: Not on file  ? Highest education level: Not on file  ?Occupational History  ? Occupation: Retired Dispensing optician  ?Tobacco Use  ? Smoking status: Former  ?  Types: Cigarettes  ?  Quit date: 01/27/1985  ?  Years since quitting: 36.2  ? Smokeless tobacco: Former  ?Vaping Use  ? Vaping Use: Never used  ?Substance and Sexual Activity  ? Alcohol use: Yes  ? Drug use: No  ? Sexual activity: Not on file  ?Other Topics Concern  ? Not on file  ?Social History Narrative  ? Not on file  ? ?Social Determinants of  Health  ? ?Financial Resource Strain: Low Risk   ? Difficulty of Paying Living Expenses: Not hard at all  ?Food Insecurity: No Food Insecurity  ? Worried About Charity fundraiser in the Last Year: Never true  ? Ran Out of Food in the Last Year: Never true  ?Transportation Needs: No Transportation Needs  ? Lack of Transportation (Medical): No  ? Lack of Transportation (Non-Medical): No  ?Physical Activity: Inactive  ? Days of Exercise per Week: 0 days  ? Minutes of Exercise per Session: 0 min  ?Stress: No Stress Concern Present  ? Feeling of Stress : Not at all  ?Social Connections: Not on file  ? ? ?Tobacco Counseling ?Counseling given: Not Answered ? ? ?Clinical Intake: ? ?Pre-visit preparation completed: Yes ? ?Pain : 0-10 ?Pain Score: 5  ?Pain Type: Chronic pain ?Pain Location: Knee ?Pain Orientation: Left ?Pain Descriptors / Indicators: Throbbing ?Pain Onset: More than a month ago ?Pain Frequency: Constant ? ?  ? ?Nutritional Status: BMI of 19-24  Normal ?Nutritional Risks: None ?Diabetes: No ? ?How often do you need to have someone help you when you read instructions, pamphlets, or other written materials from your doctor or pharmacy?: 1 - Never ?What is the last grade level you completed in school?: college ? ?Diabetic? no ? ?Interpreter Needed?: No ? ?Information entered by :: NAllen LPN ? ? ?Activities of Daily Living ? ?  04/22/2021  ?  9:54 AM  ?In your present state of health, do you have any difficulty performing the following activities:  ?Hearing? 0  ?Vision? 0  ?Difficulty concentrating or making decisions? 0  ?Walking or climbing stairs? 0  ?Dressing or bathing? 0  ?Doing errands, shopping? 0  ?Preparing Food and eating ? N  ?Using the Toilet? N  ?In the past six months, have you accidently leaked urine? N  ?Do you have problems with loss of bowel control? N  ?Managing your Medications? N  ?Managing your Finances? N  ?Housekeeping or managing your Housekeeping? N  ? ? ?Patient Care Team: ?Eulas Post, MD as PCP - General ? ?Indicate any recent Medical Services you may have received from other than [facility] providers in the past year (date may be approximate). ? ?   ?Assessment:  ? This is a routine wellness examination for Tyler Atkinson. ? ?Hearing/Vision screen ?Vision Screening - Comments:: Regular eye exams, [facility] ? ?Dietary issues and exercise activities discussed: ?Current Exercise Habits: The patient does not participate in regular exercise at present ? ? Goals Addressed   ? ?  ?  ?  ?  ? This Visit's Progress  ?  Patient Stated     ?  04/22/2021, needs to  get colonoscopy ?  ? ?  ? ?Depression Screen ? ?  04/22/2021  ?  9:54 AM 02/08/2019  ?  1:19 PM 02/02/2018  ?  9:00 AM  ?PHQ 2/9 Scores  ?PHQ - 2 Score 0 0 0  ?PHQ- 9 Score  0   ?  ?Fall Risk ? ?  04/22/2021  ?  9:53 AM 02/22/2020  ? 10:17 AM 02/08/2019  ?  1:20 PM  ?Fall Risk   ?Falls in the past year? 1 0 0  ?Comment pulling up something    ?Number falls in past yr: 1    ?Injury with Fall? 0    ?Risk for fall due to : Medication side effect    ?Follow up Falls evaluation completed;Education provided;Falls prevention discussed    ? ? ?FALL RISK PREVENTION PERTAINING TO THE HOME: ? ?Any stairs in or around the home? Yes  ?If so, are there any without handrails? No  ?Home free of loose throw rugs in walkways, pet beds, electrical cords, etc? Yes  ?Adequate lighting in your home to reduce risk of falls? Yes  ? ?ASSISTIVE DEVICES UTILIZED TO PREVENT FALLS: ? ?Life alert? No  ?Use of a cane, walker or w/c? No  ?Grab bars in the bathroom? Yes  ?Shower chair or bench in shower? No  ?Elevated toilet seat or a handicapped toilet? Yes  ? ?TIMED UP AND GO: ? ?Was the test performed? No .  ?  ? ? ? ?Cognitive Function: ?  ?  ? ?  04/22/2021  ?  9:55 AM  ?6CIT Screen  ?What Year? 0 points  ?What month? 0 points  ?What time? 0 points  ?Count back from 20 0 points  ?Months in reverse 0 points  ?Repeat phrase 2 points  ?Total Score 2 points  ? ? ?Immunizations ?Immunization  History  ?Administered Date(s) Administered  ? Fluad Quad(high Dose 65+) 11/24/2018, 10/31/2019  ? Influenza Split 01/08/2011, 11/06/2011  ? Influenza Whole 12/05/2009  ? Influenza,inj,Quad PF,6+ Mos 10/16/2

## 2021-05-01 ENCOUNTER — Ambulatory Visit (INDEPENDENT_AMBULATORY_CARE_PROVIDER_SITE_OTHER): Payer: Medicare Other

## 2021-05-01 DIAGNOSIS — Z7901 Long term (current) use of anticoagulants: Secondary | ICD-10-CM | POA: Diagnosis not present

## 2021-05-01 LAB — POCT INR: INR: 2.4 (ref 2.0–3.0)

## 2021-05-01 NOTE — Patient Instructions (Addendum)
Pre visit review using our clinic review tool, if applicable. No additional management support is needed unless otherwise documented below in the visit note. ? ?Continue to take 1 tablet daily except take 2 tablets on Mondays.  Re-check in 6 weeks. ?

## 2021-05-01 NOTE — Progress Notes (Signed)
Continue to take 1 tablet daily except take 2 tablets on Mondays.  Re-check in 6 weeks. ?

## 2021-06-12 ENCOUNTER — Ambulatory Visit: Payer: Medicare Other

## 2021-06-17 ENCOUNTER — Ambulatory Visit: Payer: Medicare Other

## 2021-06-17 ENCOUNTER — Ambulatory Visit (INDEPENDENT_AMBULATORY_CARE_PROVIDER_SITE_OTHER): Payer: Medicare Other

## 2021-06-17 DIAGNOSIS — Z7901 Long term (current) use of anticoagulants: Secondary | ICD-10-CM | POA: Diagnosis not present

## 2021-06-17 LAB — POCT INR
INR: 1.6 — AB (ref 2.0–3.0)
INR: 1.7 — AB (ref 2.0–3.0)

## 2021-06-17 NOTE — Progress Notes (Signed)
Increase dose today to take 3 tablets and increase dose tomorrow to take 1 1/2 tablets and then continue to take 1 tablet daily except take 2 tablets on Mondays.  Re-check in 2 weeks.

## 2021-06-17 NOTE — Patient Instructions (Addendum)
Pre visit review using our clinic review tool, if applicable. No additional management support is needed unless otherwise documented below in the visit note.  Increase dose today to take 3 tablets and increase dose tomorrow to take 1 1/2 tablets and then continue to take 1 tablet daily except take 2 tablets on Mondays.  Re-check in 2 weeks.

## 2021-07-03 ENCOUNTER — Ambulatory Visit (INDEPENDENT_AMBULATORY_CARE_PROVIDER_SITE_OTHER): Payer: Medicare Other

## 2021-07-03 DIAGNOSIS — Z7901 Long term (current) use of anticoagulants: Secondary | ICD-10-CM | POA: Diagnosis not present

## 2021-07-03 LAB — POCT INR: INR: 1.7 — AB (ref 2.0–3.0)

## 2021-07-03 NOTE — Patient Instructions (Addendum)
Pre visit review using our clinic review tool, if applicable. No additional management support is needed unless otherwise documented below in the visit note.  Increase dose today to take 1 1/2 tablets and then change weekly dose to take 1 tablet daily except take 2 tablets on Mondays and Thursdays. Recheck 3 weeks.

## 2021-07-03 NOTE — Progress Notes (Signed)
Increase dose today to take 1 1/2 tablets and then change weekly dose to take 1 tablet daily except take 2 tablets on Mondays and Thursdays. Recheck 3 weeks.

## 2021-07-05 ENCOUNTER — Other Ambulatory Visit: Payer: Self-pay | Admitting: Family Medicine

## 2021-07-05 DIAGNOSIS — Z7901 Long term (current) use of anticoagulants: Secondary | ICD-10-CM

## 2021-07-05 NOTE — Telephone Encounter (Signed)
Pt is compliant with warfarin management and PCP apts. ?Sent in refill.  ?

## 2021-07-24 ENCOUNTER — Ambulatory Visit (INDEPENDENT_AMBULATORY_CARE_PROVIDER_SITE_OTHER): Payer: Medicare Other

## 2021-07-24 DIAGNOSIS — Z7901 Long term (current) use of anticoagulants: Secondary | ICD-10-CM | POA: Diagnosis not present

## 2021-07-24 LAB — POCT INR: INR: 2.2 (ref 2.0–3.0)

## 2021-07-24 NOTE — Progress Notes (Signed)
Continue 1 tablet daily except take 2 tablets on Mondays and Thursdays. Recheck 4 weeks.

## 2021-07-24 NOTE — Patient Instructions (Addendum)
Pre visit review using our clinic review tool, if applicable. No additional management support is needed unless otherwise documented below in the visit note.  Continue 1 tablet daily except take 2 tablets on Mondays and Thursdays. Recheck 4 weeks.

## 2021-08-21 ENCOUNTER — Ambulatory Visit (INDEPENDENT_AMBULATORY_CARE_PROVIDER_SITE_OTHER): Payer: Medicare Other

## 2021-08-21 DIAGNOSIS — Z7901 Long term (current) use of anticoagulants: Secondary | ICD-10-CM | POA: Diagnosis not present

## 2021-08-21 LAB — POCT INR: INR: 2.6 (ref 2.0–3.0)

## 2021-08-21 NOTE — Progress Notes (Signed)
Continue 1 tablet daily except take 2 tablets on Mondays and Thursdays. Recheck 6 weeks.  

## 2021-08-21 NOTE — Patient Instructions (Addendum)
Pre visit review using our clinic review tool, if applicable. No additional management support is needed unless otherwise documented below in the visit note.  Continue 1 tablet daily except take 2 tablets on Mondays and Thursdays. Recheck 6 weeks.  

## 2021-09-02 ENCOUNTER — Other Ambulatory Visit: Payer: Self-pay | Admitting: Family Medicine

## 2021-10-02 ENCOUNTER — Ambulatory Visit (INDEPENDENT_AMBULATORY_CARE_PROVIDER_SITE_OTHER): Payer: Medicare Other

## 2021-10-02 DIAGNOSIS — Z7901 Long term (current) use of anticoagulants: Secondary | ICD-10-CM | POA: Diagnosis not present

## 2021-10-02 LAB — POCT INR: INR: 2.7 (ref 2.0–3.0)

## 2021-10-02 NOTE — Patient Instructions (Addendum)
Pre visit review using our clinic review tool, if applicable. No additional management support is needed unless otherwise documented below in the visit note.  Continue 1 tablet daily except take 2 tablets on Mondays and Thursdays. Recheck 6 weeks.  

## 2021-10-02 NOTE — Progress Notes (Signed)
Continue 1 tablet daily except take 2 tablets on Mondays and Thursdays. Recheck 6 weeks.  

## 2021-11-06 ENCOUNTER — Ambulatory Visit (INDEPENDENT_AMBULATORY_CARE_PROVIDER_SITE_OTHER): Payer: Medicare Other

## 2021-11-06 DIAGNOSIS — Z7901 Long term (current) use of anticoagulants: Secondary | ICD-10-CM | POA: Diagnosis not present

## 2021-11-06 LAB — POCT INR: INR: 2.5 (ref 2.0–3.0)

## 2021-11-06 NOTE — Patient Instructions (Signed)
Continue 1 tablet daily except take 2 tablets on Mondays and Thursdays. Recheck 6 weeks.  

## 2021-11-06 NOTE — Progress Notes (Signed)
Continue 1 tablet daily except take 2 tablets on Mondays and Thursdays. Recheck 6 weeks.  

## 2021-11-14 ENCOUNTER — Other Ambulatory Visit: Payer: Self-pay | Admitting: Family Medicine

## 2021-12-18 ENCOUNTER — Ambulatory Visit (INDEPENDENT_AMBULATORY_CARE_PROVIDER_SITE_OTHER): Payer: Medicare Other

## 2021-12-18 ENCOUNTER — Other Ambulatory Visit: Payer: Self-pay | Admitting: Family Medicine

## 2021-12-18 DIAGNOSIS — Z7901 Long term (current) use of anticoagulants: Secondary | ICD-10-CM | POA: Diagnosis not present

## 2021-12-18 LAB — POCT INR: INR: 2.9 (ref 2.0–3.0)

## 2021-12-18 NOTE — Patient Instructions (Signed)
Continue 1 tablet daily except take 2 tablets on Mondays and Thursdays. Recheck on 01/29/22 at 1:15.

## 2021-12-18 NOTE — Progress Notes (Signed)
Continue 1 tablet daily except take 2 tablets on Mondays and Thursdays. Recheck 6 weeks.

## 2022-01-23 ENCOUNTER — Other Ambulatory Visit: Payer: Self-pay | Admitting: Family Medicine

## 2022-01-23 DIAGNOSIS — Z7901 Long term (current) use of anticoagulants: Secondary | ICD-10-CM

## 2022-01-29 ENCOUNTER — Ambulatory Visit (INDEPENDENT_AMBULATORY_CARE_PROVIDER_SITE_OTHER): Payer: Medicare Other

## 2022-01-29 DIAGNOSIS — Z7901 Long term (current) use of anticoagulants: Secondary | ICD-10-CM

## 2022-01-29 LAB — POCT INR: INR: 2 (ref 2.0–3.0)

## 2022-01-29 NOTE — Progress Notes (Signed)
Continue 1 tablet daily except take 2 tablets on Mondays and Thursdays. Recheck 6 weeks.

## 2022-01-29 NOTE — Patient Instructions (Addendum)
Pre visit review using our clinic review tool, if applicable. No additional management support is needed unless otherwise documented below in the visit note.  Continue 1 tablet daily except take 2 tablets on Mondays and Thursdays. Recheck 6 weeks.  

## 2022-02-15 ENCOUNTER — Other Ambulatory Visit: Payer: Self-pay | Admitting: Family Medicine

## 2022-02-16 ENCOUNTER — Other Ambulatory Visit: Payer: Self-pay | Admitting: Family Medicine

## 2022-02-18 NOTE — Telephone Encounter (Signed)
Refill tramadol once.  Needs to set up office follow-up soon.  Not seen in 1 year by provider  Eulas Post MD [facility]

## 2022-03-03 ENCOUNTER — Encounter: Payer: Self-pay | Admitting: Family Medicine

## 2022-03-03 ENCOUNTER — Ambulatory Visit (INDEPENDENT_AMBULATORY_CARE_PROVIDER_SITE_OTHER): Payer: Medicare Other | Admitting: Family Medicine

## 2022-03-03 VITALS — BP 154/80 | HR 95 | Temp 98.2°F | Ht 70.08 in | Wt 176.1 lb

## 2022-03-03 DIAGNOSIS — Z1211 Encounter for screening for malignant neoplasm of colon: Secondary | ICD-10-CM

## 2022-03-03 DIAGNOSIS — Z125 Encounter for screening for malignant neoplasm of prostate: Secondary | ICD-10-CM

## 2022-03-03 DIAGNOSIS — I10 Essential (primary) hypertension: Secondary | ICD-10-CM | POA: Diagnosis not present

## 2022-03-03 DIAGNOSIS — E785 Hyperlipidemia, unspecified: Secondary | ICD-10-CM

## 2022-03-03 DIAGNOSIS — E039 Hypothyroidism, unspecified: Secondary | ICD-10-CM | POA: Diagnosis not present

## 2022-03-03 DIAGNOSIS — Z79899 Other long term (current) drug therapy: Secondary | ICD-10-CM | POA: Diagnosis not present

## 2022-03-03 LAB — HEPATIC FUNCTION PANEL
ALT: 20 U/L (ref 0–53)
AST: 22 U/L (ref 0–37)
Albumin: 4.7 g/dL (ref 3.5–5.2)
Alkaline Phosphatase: 73 U/L (ref 39–117)
Bilirubin, Direct: 0.1 mg/dL (ref 0.0–0.3)
Total Bilirubin: 0.8 mg/dL (ref 0.2–1.2)
Total Protein: 7.5 g/dL (ref 6.0–8.3)

## 2022-03-03 LAB — TSH: TSH: 8.07 u[IU]/mL — ABNORMAL HIGH (ref 0.35–5.50)

## 2022-03-03 LAB — CBC WITH DIFFERENTIAL/PLATELET
Basophils Absolute: 0.1 10*3/uL (ref 0.0–0.1)
Basophils Relative: 1.1 % (ref 0.0–3.0)
Eosinophils Absolute: 0.2 10*3/uL (ref 0.0–0.7)
Eosinophils Relative: 3.1 % (ref 0.0–5.0)
HCT: 41.9 % (ref 39.0–52.0)
Hemoglobin: 14.3 g/dL (ref 13.0–17.0)
Lymphocytes Relative: 30.1 % (ref 12.0–46.0)
Lymphs Abs: 2.3 10*3/uL (ref 0.7–4.0)
MCHC: 34.2 g/dL (ref 30.0–36.0)
MCV: 96 fl (ref 78.0–100.0)
Monocytes Absolute: 0.9 10*3/uL (ref 0.1–1.0)
Monocytes Relative: 12.4 % — ABNORMAL HIGH (ref 3.0–12.0)
Neutro Abs: 4.1 10*3/uL (ref 1.4–7.7)
Neutrophils Relative %: 53.3 % (ref 43.0–77.0)
Platelets: 306 10*3/uL (ref 150.0–400.0)
RBC: 4.36 Mil/uL (ref 4.22–5.81)
RDW: 13.4 % (ref 11.5–15.5)
WBC: 7.6 10*3/uL (ref 4.0–10.5)

## 2022-03-03 LAB — BASIC METABOLIC PANEL
BUN: 15 mg/dL (ref 6–23)
CO2: 27 mEq/L (ref 19–32)
Calcium: 9.4 mg/dL (ref 8.4–10.5)
Chloride: 101 mEq/L (ref 96–112)
Creatinine, Ser: 0.92 mg/dL (ref 0.40–1.50)
GFR: 85.65 mL/min (ref >60.00)
Glucose, Bld: 91 mg/dL (ref 70–99)
Potassium: 4.4 mEq/L (ref 3.5–5.1)
Sodium: 138 mEq/L (ref 135–145)

## 2022-03-03 LAB — LIPID PANEL
Cholesterol: 193 mg/dL (ref 0–200)
HDL: 63.6 mg/dL (ref >39.00)
LDL Cholesterol: 107 mg/dL — ABNORMAL HIGH (ref 0–99)
NonHDL: 129.72
Total CHOL/HDL Ratio: 3
Triglycerides: 114 mg/dL (ref 0.0–149.0)
VLDL: 22.8 mg/dL (ref 0.0–40.0)

## 2022-03-03 LAB — PSA, MEDICARE: PSA: 0.54 ng/ml (ref 0.10–4.00)

## 2022-03-03 MED ORDER — VALSARTAN 160 MG PO TABS: 160.0000 mg | ORAL_TABLET | Freq: Every day | ORAL | 3 refills | Status: DC

## 2022-03-03 MED ORDER — SERTRALINE HCL 50 MG PO TABS: 50.0000 mg | ORAL_TABLET | Freq: Every day | ORAL | 3 refills | Status: DC

## 2022-03-03 MED ORDER — ROSUVASTATIN CALCIUM 5 MG PO TABS: 5.0000 mg | ORAL_TABLET | Freq: Every day | ORAL | 3 refills | Status: DC

## 2022-03-03 MED ORDER — LEVOTHYROXINE SODIUM 88 MCG PO TABS: 88.0000 ug | ORAL_TABLET | Freq: Every day | ORAL | 3 refills | Status: DC

## 2022-03-03 MED ORDER — LIOTHYRONINE SODIUM 25 MCG PO TABS: ORAL_TABLET | ORAL | 3 refills | Status: DC

## 2022-03-03 MED ORDER — DESONIDE 0.05 % EX CREA: TOPICAL_CREAM | Freq: Two times a day (BID) | CUTANEOUS | 2 refills | Status: AC

## 2022-03-03 NOTE — Patient Instructions (Addendum)
Stop the Losartan  Start Valsartan 160 mg once daily.     We will set up referral for colonoscopy.    Set up one  month follow up.

## 2022-03-03 NOTE — Progress Notes (Signed)
Established Patient Office Visit  Subjective   Patient ID: Tyler Atkinson, male    DOB: [birth year]  Age: 67 y.o. MRN: [id number]  Chief Complaint  Patient presents with   Annual Exam    HPI   Ollin was initially scheduled for a "physical ".  However, he has primary insurance of Medicare and is here today for medical follow-up.  His past medical history is significant for unprovoked DVT left upper extremity, hypertension, hypothyroidism, history of gout Tate psoriasis, dyslipidemia.  He is generally doing well.  He had to care for several family members recently with health concerns.  He remains on Coumadin and is followed at the Coumadin clinic.  He has hypothyroidism treated with levothyroxine 88 mcg daily.  Compliant with medication.  Needs follow-up labs.  He takes low-dose Crestor for hyperlipidemia.  He had history of some recurrent depression stable on sertraline.  He has history of high blood pressure he is on losartan 50 mg daily.  Does not monitor blood pressures regularly at home.  He is overdue for colon cancer screening.  We had made referral couple times but he never followed through with setting this up.  He is willing to consider this again.  He is not interested in Cologuard.  Past Medical History:  Diagnosis Date   Depression    DVT of upper extremity (deep vein thrombosis) ([place]) 3/11   Hay fever    allergies   Hyperlipidemia    Hypothyroidism    Thyroid cancer [facility])    Past Surgical History:  Procedure Laterality Date   COLONOSCOPY     01/27/2006. Normal   THYROIDECTOMY     2000    reports that he quit smoking about 37 years ago. His smoking use included cigarettes. He has quit using smokeless tobacco. He reports current alcohol use. He reports that he does not use drugs. family history includes Alzheimer's disease in his mother; Breast cancer in an other family member; Colon cancer in an other family member; Diabetes in an other family member; Heart disease  in an other family member; Hypertension in his mother and another family member; Stroke in an other family member. Allergies  Allergen Reactions   Ezetimibe-Simvastatin     REACTION: rash X 2-3 months    Review of Systems  Constitutional:  Negative for malaise/fatigue.  Eyes:  Negative for blurred vision.  Respiratory:  Negative for shortness of breath.   Cardiovascular:  Negative for chest pain.  Neurological:  Negative for dizziness, weakness and headaches.      Objective:     BP (!) 154/80   Pulse 95   Temp 98.2 F (36.8 C) (Oral)   Ht 5' 10.08" (1.78 m)   Wt 176 lb 1.6 oz (79.9 kg)   SpO2 98%   BMI 25.21 kg/m    Physical Exam Vitals reviewed.  Constitutional:      Appearance: He is well-developed.  HENT:     Right Ear: External ear normal.     Left Ear: External ear normal.  Eyes:     Pupils: Pupils are equal, round, and reactive to light.  Neck:     Thyroid: No thyromegaly.  Cardiovascular:     Rate and Rhythm: Normal rate and regular rhythm.  Pulmonary:     Effort: Pulmonary effort is normal. No respiratory distress.     Breath sounds: Normal breath sounds. No wheezing or rales.  Musculoskeletal:     Cervical back: Neck supple.  Right lower leg: No edema.     Left lower leg: No edema.  Neurological:     Mental Status: He is alert and oriented to person, place, and time.      No results found for any visits on 03/03/22.    The 10-year ASCVD risk score (Arnett DK, et al., 2019) is: 22%    Assessment & Plan:   Problem List Items Addressed This Visit       Unprioritized   Essential hypertension - Primary   Relevant Medications   rosuvastatin (CRESTOR) 5 MG tablet   valsartan (DIOVAN) 160 MG tablet   Dyslipidemia   Relevant Medications   rosuvastatin (CRESTOR) 5 MG tablet   Other Relevant Orders   Hepatic function panel   Lipid panel   Basic metabolic panel   Hypothyroidism   Relevant Medications   levothyroxine (SYNTHROID) 88 MCG  tablet   liothyronine (CYTOMEL) 25 MCG tablet   Other Relevant Orders   TSH   Other Visit Diagnoses     Prostate cancer screening       Relevant Orders   PSA, Medicare   High risk medication use       Relevant Orders   CBC with Differential/Platelet   Colon cancer screening       Relevant Orders   Ambulatory referral to Gastroenterology     -Refilled several medications as above -Set up GI referral for repeat colonoscopy.  He is only had 1 previously.  He is aware that he would have to come off Coumadin to have this done. -We did discuss switching his losartan to valsartan 160 mg daily hopefully for better blood pressure control.  Set up 1 month follow-up and reassess at that time. -He is requesting PSA for prostate cancer screening.  This was ordered.  Return in about 1 month (around 04/01/2022).    Carolann Littler, MD

## 2022-03-04 MED ORDER — LEVOTHYROXINE SODIUM 100 MCG PO TABS: 100.0000 ug | ORAL_TABLET | Freq: Every day | ORAL | 0 refills | Status: DC

## 2022-03-04 NOTE — Addendum Note (Signed)
Addended by: Nilda Riggs on: 03/04/2022 08:48 AM   Modules accepted: Orders

## 2022-03-07 ENCOUNTER — Other Ambulatory Visit: Payer: Self-pay | Admitting: Family Medicine

## 2022-03-07 DIAGNOSIS — E039 Hypothyroidism, unspecified: Secondary | ICD-10-CM

## 2022-03-12 ENCOUNTER — Ambulatory Visit (INDEPENDENT_AMBULATORY_CARE_PROVIDER_SITE_OTHER): Payer: Medicare Other

## 2022-03-12 DIAGNOSIS — Z7901 Long term (current) use of anticoagulants: Secondary | ICD-10-CM | POA: Diagnosis not present

## 2022-03-12 LAB — POCT INR: INR: 2.7 (ref 2.0–3.0)

## 2022-03-12 MED ORDER — WARFARIN SODIUM 2.5 MG PO TABS: ORAL_TABLET | ORAL | 1 refills | Status: DC

## 2022-03-12 NOTE — Patient Instructions (Addendum)
Pre visit review using our clinic review tool, if applicable. No additional management support is needed unless otherwise documented below in the visit note.  Continue 1 tablet daily except take 2 tablets on Mondays and Thursdays. Recheck 6 weeks.

## 2022-03-12 NOTE — Progress Notes (Signed)
Pt started daily multivitamin (containing 60 mcg vit K), ensure drink every other day and has reduced greens in his diet.  Continue 1 tablet daily except take 2 tablets on Mondays and Thursdays. Recheck 6 weeks.

## 2022-03-27 ENCOUNTER — Encounter: Payer: Self-pay | Admitting: Gastroenterology

## 2022-03-31 ENCOUNTER — Ambulatory Visit (INDEPENDENT_AMBULATORY_CARE_PROVIDER_SITE_OTHER): Payer: Medicare Other | Admitting: Family Medicine

## 2022-03-31 ENCOUNTER — Encounter: Payer: Self-pay | Admitting: Family Medicine

## 2022-03-31 ENCOUNTER — Telehealth: Payer: Self-pay | Admitting: Family Medicine

## 2022-03-31 VITALS — BP 140/68 | HR 93 | Temp 98.3°F | Ht 70.0 in | Wt 178.0 lb

## 2022-03-31 DIAGNOSIS — I10 Essential (primary) hypertension: Secondary | ICD-10-CM | POA: Diagnosis not present

## 2022-03-31 NOTE — Patient Instructions (Signed)
Try to lose a few pounds  Start more regular exercise such as walking  Try to scale back the beer to no more than 24 ounces per day.    Be in touch if systolic consistently > [id number]

## 2022-03-31 NOTE — Progress Notes (Signed)
Established Patient Office Visit  Subjective   Patient ID: Tyler Atkinson, male    DOB: [birth year]  Age: 67 y.o. MRN: [id number]  Chief Complaint  Patient presents with   Medical Management of Chronic Issues    HPI   [name] is seen for follow-up hypertension.  We recently switched his blood pressure medication from losartan to valsartan.    Home blood pressures have been frequently [id number] systolic with some low 0000000.  No headaches or dizziness.  Does drink about 3-4 beers per day also take Allegra-D.  Tolerating Diovan without side effects.  He enjoys gardening but does not get a lot of exercise otherwise.  Past Medical History:  Diagnosis Date   Depression    DVT of upper extremity (deep vein thrombosis) ([place]) 3/11   Hay fever    allergies   Hyperlipidemia    Hypothyroidism    Thyroid cancer [facility])    Past Surgical History:  Procedure Laterality Date   COLONOSCOPY     01/27/2006. Normal   THYROIDECTOMY     2000    reports that he quit smoking about 37 years ago. His smoking use included cigarettes. He has quit using smokeless tobacco. He reports current alcohol use. He reports that he does not use drugs. family history includes Alzheimer's disease in his mother; Breast cancer in an other family member; Colon cancer in an other family member; Diabetes in an other family member; Heart disease in an other family member; Hypertension in his mother and another family member; Stroke in an other family member. Allergies  Allergen Reactions   Ezetimibe-Simvastatin     REACTION: rash X 2-3 months    Review of Systems  Constitutional:  Negative for malaise/fatigue.  Eyes:  Negative for blurred vision.  Respiratory:  Negative for shortness of breath.   Cardiovascular:  Negative for chest pain.  Neurological:  Negative for dizziness, weakness and headaches.      Objective:     BP (!) 140/68 (BP Location: Left Arm, Cuff Size: Normal)   Pulse 93   Temp 98.3 F (36.8 C) (Oral)    Ht '5\' 10"'$  (1.778 m)   Wt 178 lb (80.7 kg)   SpO2 97%   BMI 25.54 kg/m    Physical Exam Vitals reviewed.  Constitutional:      Appearance: He is well-developed.  HENT:     Right Ear: External ear normal.     Left Ear: External ear normal.  Eyes:     Pupils: Pupils are equal, round, and reactive to light.  Neck:     Thyroid: No thyromegaly.  Cardiovascular:     Rate and Rhythm: Normal rate and regular rhythm.  Pulmonary:     Effort: Pulmonary effort is normal. No respiratory distress.     Breath sounds: Normal breath sounds. No wheezing or rales.  Musculoskeletal:     Cervical back: Neck supple.  Neurological:     Mental Status: He is alert and oriented to person, place, and time.      No results found for any visits on 03/31/22.    The 10-year ASCVD risk score (Arnett DK, et al., 2019) is: 18.5%    Assessment & Plan:   Hypertension somewhat improved by readings here today.  He still has some home readings over 140 but these have trended toward better improvement with several 120s range.  We elected to keep his medications unchanged at this time.  We have instructed the following nonpharmacologic factors  -Handout  on DASH diet given.  Particularly focused on reducing sodium intake -Reduce beer to no more than 24 ounces per day -Try to establish more consistent aerobic exercise -Try to lose a few pounds -Set up 32-monthfollow-up.  If not closer to goal at that time consider addition of low-dose HCTZ -I also advised to avoid Allegra-D or any other decongestants  BCarolann Littler MD

## 2022-03-31 NOTE — Telephone Encounter (Signed)
Pt needs to reschedule his Medicare Well Visit//tes

## 2022-04-14 ENCOUNTER — Telehealth: Payer: Self-pay | Admitting: Family Medicine

## 2022-04-14 NOTE — Telephone Encounter (Signed)
Scheduled 4/3

## 2022-04-14 NOTE — Telephone Encounter (Signed)
Contacted Claudine Mouton to schedule their annual wellness visit. Appointment made for 04/30/22.  [employer] [facility] direct phone # [phone]

## 2022-04-16 ENCOUNTER — Other Ambulatory Visit: Payer: Self-pay | Admitting: Family Medicine

## 2022-04-23 ENCOUNTER — Ambulatory Visit: Payer: Medicare Other

## 2022-04-28 ENCOUNTER — Ambulatory Visit: Payer: Medicare Other

## 2022-04-30 ENCOUNTER — Ambulatory Visit (INDEPENDENT_AMBULATORY_CARE_PROVIDER_SITE_OTHER): Payer: Medicare Other

## 2022-04-30 VITALS — Ht 70.0 in | Wt 178.0 lb

## 2022-04-30 DIAGNOSIS — Z7901 Long term (current) use of anticoagulants: Secondary | ICD-10-CM | POA: Diagnosis not present

## 2022-04-30 DIAGNOSIS — Z0000 Encounter for general adult medical examination without abnormal findings: Secondary | ICD-10-CM

## 2022-04-30 LAB — POCT INR: INR: 2.5 (ref 2.0–3.0)

## 2022-04-30 NOTE — Progress Notes (Addendum)
Pt reports he will have an upcoming colonoscopy. He will contact coumadin clinic when he has been scheduled.  Continue 1 tablet daily except take 2 tablets on Mondays and Thursdays. Recheck 6 weeks.

## 2022-04-30 NOTE — Progress Notes (Signed)
Subjective:   Tyler Atkinson is a 67 y.o. male who presents for Medicare Annual/Subsequent preventive examination.  Review of Systems    Virtual Visit via Telephone Note  I connected with  Claudine Mouton on 04/30/22 at 10:15 AM EDT by telephone and verified that I am speaking with the correct person using two identifiers.  Location: Patient: Home Provider: Office Persons participating in the virtual visit: patient/Nurse Health Advisor   I discussed the limitations, risks, security and privacy concerns of performing an evaluation and management service by telephone and the availability of in person appointments. The patient expressed understanding and agreed to proceed.  Interactive audio and video telecommunications were attempted between this nurse and patient, however failed, due to patient having technical difficulties OR patient did not have access to video capability.  We continued and completed visit with audio only.  Some vital signs may be absent or patient reported.   Criselda Peaches, LPN  Cardiac Risk Factors include: advanced age (>51men, >25 women);hypertension;male gender     Objective:    Today's Vitals   04/30/22 1020  Weight: 178 lb (80.7 kg)  Height: 5\' 10"  (1.778 m)   Body mass index is 25.54 kg/m.     04/30/2022   10:27 AM 04/22/2021    9:52 AM  Advanced Directives  Does Patient Have a Medical Advance Directive? No No  Would patient like information on creating a medical advance directive? No - Patient declined     Current Medications (verified) Outpatient Encounter Medications as of 04/30/2022  Medication Sig   desonide (DESOWEN) 0.05 % cream Apply topically 2 (two) times daily.   fexofenadine-pseudoephedrine (ALLEGRA-D) 60-120 MG per tablet Take 1 tablet by mouth daily as needed.   levothyroxine (SYNTHROID) 100 MCG tablet Take 1 tablet (100 mcg total) by mouth daily.   liothyronine (CYTOMEL) 25 MCG tablet TAKE 1/2 TABLET (12.5 MCG TOTAL) BY MOUTH  DAILY.   rosuvastatin (CRESTOR) 5 MG tablet Take 1 tablet (5 mg total) by mouth daily.   sertraline (ZOLOFT) 50 MG tablet Take 1 tablet (50 mg total) by mouth daily.   tadalafil (CIALIS) 20 MG tablet ONE BY MOUTH EVERY OTHER DAY AS NEEDED   traMADol (ULTRAM) 50 MG tablet TAKE 1 TABLET BY MOUTH EVERY 12 HOURS AS NEEDED   valsartan (DIOVAN) 160 MG tablet Take 1 tablet (160 mg total) by mouth daily.   warfarin (COUMADIN) 2.5 MG tablet TAKE 1 TABLET BY MOUTH DAILY, EXCEPT TAKE 2 TABLETS ON MONDAYS AND THURSDAYS. OR TAKE AS DIRECTED BY ANTICOAGULATION CLINIC. NEED APPT WITH PCP.   No facility-administered encounter medications on file as of 04/30/2022.    Allergies (verified) Ezetimibe-simvastatin   History: Past Medical History:  Diagnosis Date   Depression    DVT of upper extremity (deep vein thrombosis) 3/11   Hay fever    allergies   Hyperlipidemia    Hypothyroidism    Thyroid cancer    Past Surgical History:  Procedure Laterality Date   COLONOSCOPY     01/27/2006. Normal   THYROIDECTOMY     2000   Family History  Problem Relation Age of Onset   Colon cancer Other        blood relative   Breast cancer Other        Grandparent   Heart disease Other        blood relative   Hypertension Other    Stroke Other    Diabetes Other    Hypertension Mother  Alzheimer's disease Mother    Social History   Socioeconomic History   Marital status: Married    Spouse name: Not on file   Number of children: Not on file   Years of education: Not on file   Highest education level: Not on file  Occupational History   Occupation: Retired Dispensing optician  Tobacco Use   Smoking status: Former    Types: Cigarettes    Quit date: 01/27/1985    Years since quitting: 37.2   Smokeless tobacco: Former  Scientific laboratory technician Use: Never used  Substance and Sexual Activity   Alcohol use: Yes   Drug use: No   Sexual activity: Not on file  Other Topics Concern   Not on file  Social  History Narrative   Not on file   Social Determinants of Health   Financial Resource Strain: Low Risk  (04/30/2022)   Overall Financial Resource Strain (CARDIA)    Difficulty of Paying Living Expenses: Not hard at all  Food Insecurity: No Food Insecurity (04/30/2022)   Hunger Vital Sign    Worried About Running Out of Food in the Last Year: Never true    [place] in the Last Year: Never true  Transportation Needs: No Transportation Needs (04/30/2022)   PRAPARE - Hydrologist (Medical): No    Lack of Transportation (Non-Medical): No  Physical Activity: Inactive (04/30/2022)   Exercise Vital Sign    Days of Exercise per Week: 0 days    Minutes of Exercise per Session: 0 min  Stress: No Stress Concern Present (04/30/2022)   [place]    Feeling of Stress : Not at all  Social Connections: [place] (04/30/2022)   Social Connection and Isolation Panel [NHANES]    Frequency of Communication with Friends and Family: More than three times a week    Frequency of Social Gatherings with Friends and Family: More than three times a week    Attends Religious Services: More than 4 times per year    Active Member of [employer] or Organizations: Yes    Attends Music therapist: More than 4 times per year    Marital Status: Married    Tobacco Counseling Counseling given: Not Answered   Clinical Intake:  Pre-visit preparation completed: Yes  Pain : No/denies pain     BMI - recorded: 25.54 Nutritional Status: BMI 25 -29 Overweight Nutritional Risks: None Diabetes: No  How often do you need to have someone help you when you read instructions, pamphlets, or other written materials from your doctor or pharmacy?: 1 - Never  Diabetic?  No  Interpreter Needed?: No  Information entered by :: Rolene Arbour LPN   Activities of Daily Living    04/30/2022   10:25 AM 04/30/2022     7:24 AM  In your present state of health, do you have any difficulty performing the following activities:  Hearing? 0 0  Vision? 0 0  Difficulty concentrating or making decisions? 0 0  Walking or climbing stairs? 0 0  Dressing or bathing? 0 0  Doing errands, shopping? 0 0  Preparing Food and eating ? N N  Using the Toilet? N N  In the past six months, have you accidently leaked urine? N N  Do you have problems with loss of bowel control? N N  Managing your Medications? N N  Managing your Finances? N N  Housekeeping or  managing your Housekeeping? N N    Patient Care Team: Eulas Post, MD as PCP - General (Family Medicine)  Indicate any recent Medical Services you may have received from other than [facility] providers in the past year (date may be approximate).     Assessment:   This is a routine wellness examination for Buryl.  Hearing/Vision screen Hearing Screening - Comments:: Denies hearing difficulties   Vision Screening - Comments:: Wears rx glasses - up to date with routine eye exams with  [place] issues and exercise activities discussed: Current Exercise Habits: The patient does not participate in regular exercise at present, Exercise limited by: None identified   Goals Addressed               This Visit's Progress     Stay Healthy (pt-stated)        Eat healthy.       Depression Screen    04/30/2022   10:24 AM 03/03/2022   10:33 AM 04/22/2021    9:54 AM 02/08/2019    1:19 PM 02/02/2018    9:00 AM  PHQ 2/9 Scores  PHQ - 2 Score 0 0 0 0 0  PHQ- 9 Score    0     Fall Risk    04/30/2022   10:26 AM 04/30/2022    7:24 AM 03/03/2022   10:34 AM 04/22/2021    9:53 AM 02/22/2020   10:17 AM  Fall Risk   Falls in the past year? 0 0 0 1 0  Comment    pulling up something   Number falls in past yr: 0 0 0 1   Injury with Fall? 0 0 0 0   Risk for fall due to : No Fall Risks  No Fall Risks Medication side effect   Follow up Falls prevention discussed   Falls evaluation completed Falls evaluation completed;Education provided;Falls prevention discussed     FALL RISK PREVENTION PERTAINING TO THE HOME:  Any stairs in or around the home? Yes  If so, are there any without handrails? No  Home free of loose throw rugs in walkways, pet beds, electrical cords, etc? Yes  Adequate lighting in your home to reduce risk of falls? Yes   ASSISTIVE DEVICES UTILIZED TO PREVENT FALLS:  Life alert? No  Use of a cane, walker or w/c? No  Grab bars in the bathroom? No  Shower chair or bench in shower? Yes  Elevated toilet seat or a handicapped toilet? Yes   TIMED UP AND GO:  Was the test performed? No . Audio Visit   Cognitive Function:        04/30/2022   10:27 AM 04/22/2021    9:55 AM  6CIT Screen  What Year? 0 points 0 points  What month? 0 points 0 points  What time? 0 points 0 points  Count back from 20 0 points 0 points  Months in reverse 0 points 0 points  Repeat phrase 0 points 2 points  Total Score 0 points 2 points    Immunizations Immunization History  Administered Date(s) Administered   Fluad Quad(high Dose 65+) 1955/06/24, 11/24/2018, 10/31/2019, 10/27/2021   Influenza Split 01/08/2011, 11/06/2011   Influenza Whole 12/05/2009   Influenza,inj,Quad PF,6+ Mos 11/11/2012, 01/17/2014, 11/09/2014, 10/31/2015, 12/17/2016, 11/18/2017   Influenza-Unspecified 11/13/2020   Moderna Covid-19 Vaccine Bivalent Booster 9yrs & up 11/13/2020   Moderna Sars-Covid-2 Vaccination 04/27/2019, 11/23/2019   Pneumococcal Conjugate-13 02/08/2019   Pneumococcal Polysaccharide-23 02/22/2020   Td 01/28/2003  Tdap 01/23/2016   Zoster Recombinat (Shingrix) 06/29/2021, 10/17/2021   Zoster, Live 01/17/2014    TDAP status: Up to date  Flu Vaccine status: Up to date  Pneumococcal vaccine status: Up to date  Covid-19 vaccine status: Completed vaccines  Qualifies for Shingles Vaccine? Yes   Zostavax completed Yes   Shingrix Completed?:  Yes  Screening Tests Health Maintenance  Topic Date Due   COVID-19 Vaccine (4 - 2023-24 season) 05/16/2022 (Originally 09/27/2021)   COLONOSCOPY (Pts 45-63yrs Insurance coverage will need to be confirmed)  04/30/2023 (Originally 07/14/2016)   INFLUENZA VACCINE  08/28/2022   Medicare Annual Wellness (AWV)  04/30/2023   DTaP/Tdap/Td (3 - Td or Tdap) 01/22/2026   Pneumonia Vaccine 2+ Years old  Completed   Hepatitis C Screening  Completed   Zoster Vaccines- Shingrix  Completed   HPV VACCINES  Aged Out    Health Maintenance  There are no preventive care reminders to display for this patient.   Colorectal cancer screening: Referral to GI placed Deferred. Pt aware the office will call re: appt.  Lung Cancer Screening: (Low Dose CT Chest recommended if Age 64-80 years, 30 pack-year currently smoking OR have quit w/in 15years.) does not qualify.     Additional Screening:  Hepatitis C Screening: does qualify; Completed 01/30/17  Vision Screening: Recommended annual ophthalmology exams for early detection of glaucoma and other disorders of the eye. Is the patient up to date with their annual eye exam?  Yes  Who is the provider or what is the name of the office in which the patient attends annual eye exams? [facility] If pt is not established with a provider, would they like to be referred to a provider to establish care? No .   Dental Screening: Recommended annual dental exams for proper oral hygiene  Community Resource Referral / Chronic Care Management:  CRR required this visit?  No   CCM required this visit?  No      Plan:     I have personally reviewed and noted the following in the patient's chart:   Medical and social history Use of alcohol, tobacco or illicit drugs  Current medications and supplements including opioid prescriptions. Patient is currently taking opioid prescriptions. Information provided to patient regarding non-opioid alternatives. Patient advised to  discuss non-opioid treatment plan with their provider. Functional ability and status Nutritional status Physical activity Advanced directives List of other physicians Hospitalizations, surgeries, and ER visits in previous 12 months Vitals Screenings to include cognitive, depression, and falls Referrals and appointments  In addition, I have reviewed and discussed with patient certain preventive protocols, quality metrics, and best practice recommendations. A written personalized care plan for preventive services as well as general preventive health recommendations were provided to patient.     Criselda Peaches, LPN   [phone]   Nurse Notes: None

## 2022-04-30 NOTE — Patient Instructions (Addendum)
Pre visit review using our clinic review tool, if applicable. No additional management support is needed unless otherwise documented below in the visit note.  Continue 1 tablet daily except take 2 tablets on Mondays and Thursdays. Recheck 6 weeks.  

## 2022-04-30 NOTE — Patient Instructions (Addendum)
Mr. Tyler Atkinson , Thank you for taking time to come for your Medicare Wellness Visit. I appreciate your ongoing commitment to your health goals. Please review the following plan we discussed and let me know if I can assist you in the future.   These are the goals we discussed:  Goals       Patient Stated      04/22/2021, needs to get colonoscopy      Stay Healthy (pt-stated)      Eat healthy.        This is a list of the screening recommended for you and due dates:  Health Maintenance  Topic Date Due   COVID-19 Vaccine (4 - 2023-24 season) 05/16/2022*   Colon Cancer Screening  04/30/2023*   Flu Shot  08/28/2022   Medicare Annual Wellness Visit  04/30/2023   DTaP/Tdap/Td vaccine (3 - Td or Tdap) 01/22/2026   Pneumonia Vaccine  Completed   Hepatitis C Screening: USPSTF Recommendation to screen - Ages 77-79 yo.  Completed   Zoster (Shingles) Vaccine  Completed   HPV Vaccine  Aged Out  *Topic was postponed. The date shown is not the original due date.   Opioid Pain Medicine Management Opioids are powerful medicines that are used to treat moderate to severe pain. When used for short periods of time, they can help you to: Sleep better. Do better in physical or occupational therapy. Feel better in the first few days after an injury. Recover from surgery. Opioids should be taken with the supervision of a trained health care provider. They should be taken for the shortest period of time possible. This is because opioids can be addictive, and the longer you take opioids, the greater your risk of addiction. This addiction can also be called opioid use disorder. What are the risks? Using opioid pain medicines for longer than 3 days increases your risk of side effects. Side effects include: Constipation. Nausea and vomiting. Breathing difficulties (respiratory depression). Drowsiness. Confusion. Opioid use disorder. Itching. Taking opioid pain medicine for a long period of time can affect  your ability to do daily tasks. It also puts you at risk for: Motor vehicle crashes. Depression. Suicide. Heart attack. Overdose, which can be life-threatening. What is a pain treatment plan? A pain treatment plan is an agreement between you and your health care provider. Pain is unique to each person, and treatments vary depending on your condition. To manage your pain, you and your health care provider need to work together. To help you do this: Discuss the goals of your treatment, including how much pain you might expect to have and how you will manage the pain. Review the risks and benefits of taking opioid medicines. Remember that a good treatment plan uses more than one approach and minimizes the chance of side effects. Be honest about the amount of medicines you take and about any drug or alcohol use. Get pain medicine prescriptions from only one health care provider. Pain can be managed with many types of alternative treatments. Ask your health care provider to refer you to one or more specialists who can help you manage pain through: Physical or occupational therapy. Counseling (cognitive behavioral therapy). Good nutrition. Biofeedback. Massage. Meditation. Non-opioid medicine. Following a gentle exercise program. How to use opioid pain medicine Taking medicine Take your pain medicine exactly as told by your health care provider. Take it only when you need it. If your pain gets less severe, you may take less than your prescribed dose if your  health care provider approves. If you are not having pain, do nottake pain medicine unless your health care provider tells you to take it. If your pain is severe, do nottry to treat it yourself by taking more pills than instructed on your prescription. Contact your health care provider for help. Write down the times when you take your pain medicine. It is easy to become confused while on pain medicine. Writing the time can help you avoid  overdose. Take other over-the-counter or prescription medicines only as told by your health care provider. Keeping yourself and others safe  While you are taking opioid pain medicine: Do not drive, use machinery, or power tools. Do not sign legal documents. Do not drink alcohol. Do not take sleeping pills. Do not supervise children by yourself. Do not do activities that require climbing or being in high places. Do not go to a lake, river, ocean, spa, or swimming pool. Do not share your pain medicine with anyone. Keep pain medicine in a locked cabinet or in a secure area where pets and children cannot reach it. Stopping your use of opioids If you have been taking opioid medicine for more than a few weeks, you may need to slowly decrease (taper) how much you take until you stop completely. Tapering your use of opioids can decrease your risk of symptoms of withdrawal, such as: Pain and cramping in the abdomen. Nausea. Sweating. Sleepiness. Restlessness. Uncontrollable shaking (tremors). Cravings for the medicine. Do not attempt to taper your use of opioids on your own. Talk with your health care provider about how to do this. Your health care provider may prescribe a step-down schedule based on how much medicine you are taking and how long you have been taking it. Getting rid of leftover pills Do not save any leftover pills. Get rid of leftover pills safely by: Taking the medicine to a prescription take-back program. This is usually offered by the county or law enforcement. Bringing them to a pharmacy that has a drug disposal container. Flushing them down the toilet. Check the label or package insert of your medicine to see whether this is safe to do. Throwing them out in the trash. Check the label or package insert of your medicine to see whether this is safe to do. If it is safe to throw it out, remove the medicine from the original container, put it into a sealable bag or container, and  mix it with used coffee grounds, food scraps, dirt, or cat litter before putting it in the trash. Follow these instructions at home: Activity Do exercises as told by your health care provider. Avoid activities that make your pain worse. Return to your normal activities as told by your health care provider. Ask your health care provider what activities are safe for you. General instructions You may need to take these actions to prevent or treat constipation: Drink enough fluid to keep your urine pale yellow. Take over-the-counter or prescription medicines. Eat foods that are high in fiber, such as beans, whole grains, and fresh fruits and vegetables. Limit foods that are high in fat and processed sugars, such as fried or sweet foods. Keep all follow-up visits. This is important. Where to find support If you have been taking opioids for a long time, you may benefit from receiving support for quitting from a local support group or counselor. Ask your health care provider for a referral to these resources in your area. Where to find more information Centers for Disease Control and  Prevention (CDC): http://www.wolf.info/ [place]. Food and Drug Administration (FDA): [place] Get help right away if: You may have taken too much of an opioid (overdosed). Common symptoms of an overdose: Your breathing is slower or more shallow than normal. You have a very slow heartbeat (pulse). You have slurred speech. You have nausea and vomiting. Your pupils become very small. You have other potential symptoms: You are very confused. You faint or feel like you will faint. You have cold, clammy skin. You have blue lips or fingernails. You have thoughts of harming yourself or harming others. These symptoms may represent a serious problem that is an emergency. Do not wait to see if the symptoms will go away. Get medical help right away. Call your local emergency services (911 in the [place] Do not drive yourself to the  hospital.  If you ever feel like you may hurt yourself or others, or have thoughts about taking your own life, get help right away. Go to your nearest emergency department or: Call your local emergency services (911 in the [place] Call the [facility] (260)166-7234 in the [place] Call a suicide crisis helpline, such as the [place] at 534-820-3736 or 988 in the [place]. This is open 24 hours a day in the [place] Text the Crisis Text Line at (980)712-8637 (in the [place].). Summary Opioid medicines can help you manage moderate to severe pain for a short period of time. A pain treatment plan is an agreement between you and your health care provider. Discuss the goals of your treatment, including how much pain you might expect to have and how you will manage the pain. If you think that you or someone else may have taken too much of an opioid, get medical help right away. This information is not intended to replace advice given to you by your health care provider. Make sure you discuss any questions you have with your health care provider. Document Revised: 08/08/2020 Document Reviewed: 04/25/2020 Elsevier Patient Education  [place] directives: Advance directive discussed with you today. Even though you declined this today, please call our office should you change your mind, and we can give you the proper paperwork for you to fill out.   Conditions/risks identified: None  Next appointment: Follow up in one year for your annual wellness visit.   Preventive Care 42 Years and Older, Male  Preventive care refers to lifestyle choices and visits with your health care provider that can promote health and wellness. What does preventive care include? A yearly physical exam. This is also called an annual well check. Dental exams once or twice a year. Routine eye exams. Ask your health care provider how often you should have your eyes checked. Personal  lifestyle choices, including: Daily care of your teeth and gums. Regular physical activity. Eating a healthy diet. Avoiding tobacco and drug use. Limiting alcohol use. Practicing safe sex. Taking low doses of aspirin every day. Taking vitamin and mineral supplements as recommended by your health care provider. What happens during an annual well check? The services and screenings done by your health care provider during your annual well check will depend on your age, overall health, lifestyle risk factors, and family history of disease. Counseling  Your health care provider may ask you questions about your: Alcohol use. Tobacco use. Drug use. Emotional well-being. Home and relationship well-being. Sexual activity. Eating habits. History of falls. Memory and ability to understand (cognition). Work and work Statistician. Screening  You  may have the following tests or measurements: Height, weight, and BMI. Blood pressure. Lipid and cholesterol levels. These may be checked every 5 years, or more frequently if you are over 50 years old. Skin check. Lung cancer screening. You may have this screening every year starting at age 58 if you have a 30-pack-year history of smoking and currently smoke or have quit within the past 15 years. Fecal occult blood test (FOBT) of the stool. You may have this test every year starting at age 53. Flexible sigmoidoscopy or colonoscopy. You may have a sigmoidoscopy every 5 years or a colonoscopy every 10 years starting at age 66. Prostate cancer screening. Recommendations will vary depending on your family history and other risks. Hepatitis C blood test. Hepatitis B blood test. Sexually transmitted disease (STD) testing. Diabetes screening. This is done by checking your blood sugar (glucose) after you have not eaten for a while (fasting). You may have this done every 1-3 years. Abdominal aortic aneurysm (AAA) screening. You may need this if you are a  current or former smoker. Osteoporosis. You may be screened starting at age 75 if you are at high risk. Talk with your health care provider about your test results, treatment options, and if necessary, the need for more tests. Vaccines  Your health care provider may recommend certain vaccines, such as: Influenza vaccine. This is recommended every year. Tetanus, diphtheria, and acellular pertussis (Tdap, Td) vaccine. You may need a Td booster every 10 years. Zoster vaccine. You may need this after age 47. Pneumococcal 13-valent conjugate (PCV13) vaccine. One dose is recommended after age 68. Pneumococcal polysaccharide (PPSV23) vaccine. One dose is recommended after age 27. Talk to your health care provider about which screenings and vaccines you need and how often you need them. This information is not intended to replace advice given to you by your health care provider. Make sure you discuss any questions you have with your health care provider. Document Released: 02/09/2015 Document Revised: 10/03/2015 Document Reviewed: 11/14/2014 Elsevier Interactive Patient Education  2017 [place] Prevention in the Home Falls can cause injuries. They can happen to people of all ages. There are many things you can do to make your home safe and to help prevent falls. What can I do on the outside of my home? Regularly fix the edges of walkways and driveways and fix any cracks. Remove anything that might make you trip as you walk through a door, such as a raised step or threshold. Trim any bushes or trees on the path to your home. Use bright outdoor lighting. Clear any walking paths of anything that might make someone trip, such as rocks or tools. Regularly check to see if handrails are loose or broken. Make sure that both sides of any steps have handrails. Any raised decks and porches should have guardrails on the edges. Have any leaves, snow, or ice cleared regularly. Use sand or salt on  walking paths during winter. Clean up any spills in your garage right away. This includes oil or grease spills. What can I do in the bathroom? Use night lights. Install grab bars by the toilet and in the tub and shower. Do not use towel bars as grab bars. Use non-skid mats or decals in the tub or shower. If you need to sit down in the shower, use a plastic, non-slip stool. Keep the floor dry. Clean up any water that spills on the floor as soon as it happens. Remove soap buildup in the  tub or shower regularly. Attach bath mats securely with double-sided non-slip rug tape. Do not have throw rugs and other things on the floor that can make you trip. What can I do in the bedroom? Use night lights. Make sure that you have a light by your bed that is easy to reach. Do not use any sheets or blankets that are too big for your bed. They should not hang down onto the floor. Have a firm chair that has side arms. You can use this for support while you get dressed. Do not have throw rugs and other things on the floor that can make you trip. What can I do in the kitchen? Clean up any spills right away. Avoid walking on wet floors. Keep items that you use a lot in easy-to-reach places. If you need to reach something above you, use a strong step stool that has a grab bar. Keep electrical cords out of the way. Do not use floor polish or wax that makes floors slippery. If you must use wax, use non-skid floor wax. Do not have throw rugs and other things on the floor that can make you trip. What can I do with my stairs? Do not leave any items on the stairs. Make sure that there are handrails on both sides of the stairs and use them. Fix handrails that are broken or loose. Make sure that handrails are as long as the stairways. Check any carpeting to make sure that it is firmly attached to the stairs. Fix any carpet that is loose or worn. Avoid having throw rugs at the top or bottom of the stairs. If you do  have throw rugs, attach them to the floor with carpet tape. Make sure that you have a light switch at the top of the stairs and the bottom of the stairs. If you do not have them, ask someone to add them for you. What else can I do to help prevent falls? Wear shoes that: Do not have high heels. Have rubber bottoms. Are comfortable and fit you well. Are closed at the toe. Do not wear sandals. If you use a stepladder: Make sure that it is fully opened. Do not climb a closed stepladder. Make sure that both sides of the stepladder are locked into place. Ask someone to hold it for you, if possible. Clearly mark and make sure that you can see: Any grab bars or handrails. First and last steps. Where the edge of each step is. Use tools that help you move around (mobility aids) if they are needed. These include: Canes. Walkers. Scooters. Crutches. Turn on the lights when you go into a dark area. Replace any light bulbs as soon as they burn out. Set up your furniture so you have a clear path. Avoid moving your furniture around. If any of your floors are uneven, fix them. If there are any pets around you, be aware of where they are. Review your medicines with your doctor. Some medicines can make you feel dizzy. This can increase your chance of falling. Ask your doctor what other things that you can do to help prevent falls. This information is not intended to replace advice given to you by your health care provider. Make sure you discuss any questions you have with your health care provider. Document Released: 11/09/2008 Document Revised: 06/21/2015 Document Reviewed: 02/17/2014 Elsevier Interactive Patient Education  2017 [employer].

## 2022-05-07 ENCOUNTER — Ambulatory Visit (INDEPENDENT_AMBULATORY_CARE_PROVIDER_SITE_OTHER): Payer: Medicare Other | Admitting: Gastroenterology

## 2022-05-07 ENCOUNTER — Encounter: Payer: Self-pay | Admitting: Gastroenterology

## 2022-05-07 ENCOUNTER — Telehealth: Payer: Self-pay

## 2022-05-07 VITALS — BP 132/78 | HR 91 | Ht 70.5 in | Wt 177.0 lb

## 2022-05-07 DIAGNOSIS — Z7901 Long term (current) use of anticoagulants: Secondary | ICD-10-CM

## 2022-05-07 DIAGNOSIS — Z1211 Encounter for screening for malignant neoplasm of colon: Secondary | ICD-10-CM

## 2022-05-07 MED ORDER — NA SULFATE-K SULFATE-MG SULF 17.5-3.13-1.6 GM/177ML PO SOLN: 1.0000 | Freq: Once | ORAL | 0 refills | Status: AC

## 2022-05-07 NOTE — Patient Instructions (Signed)
_______________________________________________________  If your blood pressure at your visit was 140/90 or greater, please contact your primary care physician to follow up on this.  _______________________________________________________  If you are age 69 or older, your body mass index should be between 23-30. Your Body mass index is 25.04 kg/m. If this is out of the aforementioned range listed, please consider follow up with your Primary Care Provider.  If you are age 20 or younger, your body mass index should be between 19-25. Your Body mass index is 25.04 kg/m. If this is out of the aformentioned range listed, please consider follow up with your Primary Care Provider.   ________________________________________________________  The [facility] providers would like to encourage you to use [facility] to communicate with providers for non-urgent requests or questions.  Due to long hold times on the telephone, sending your provider a message by [facility] may be a faster and more efficient way to get a response.  Please allow 48 business hours for a response.  Please remember that this is for non-urgent requests.  _______________________________________________________  Tyler Atkinson have been scheduled for a colonoscopy. Please follow written instructions given to you at your visit today.  Please pick up your prep supplies at the pharmacy within the next 1-3 days. If you use inhalers (even only as needed), please bring them with you on the day of your procedure.  You will be contacted by our office prior to your procedure for directions on holding your blood thinner.  If you do not hear from our office 2 week prior to your scheduled procedure, please call [phone] to discuss.   We have sent the following medications to your pharmacy for you to pick up at your convenience: Suprep  It was a pleasure to see you today!  Thank you for trusting me with your gastrointestinal care!

## 2022-05-07 NOTE — Telephone Encounter (Signed)
Recommendation for Lovenox bridge for colonoscopy. Indication is DVT, CHADS-VASc score 2, HAS-BLED score 1.  Wt: 80 kg CrCl: 86.96 mL/min  Recommend Lovenox, 1 mg/kg, QD, (120 mg) Current warfarin dosing is take 2.5 mg daily except take 5 mg on Mondays and Thursdays.  Lovenox Bridge;  5/19: Take last dose of warfarin 5/20: No warfarin, No Lovenox 5/21: No warfarin, Lovenox in the AM 5/22: No warfarin, Lovenox in the AM 5/23: No warfarin, Lovenox in the AM (before 7AM)  5/24: Colonoscopy; NO WARFARIN, NO LOVENOX  5/25: Take 1 1/2 tablets (3.75 mg) warfarin, Lovenox in the AM 5/26: Take 1 1/2 tablets (3.75 mg) warfarin, Lovenox in the AM 5/27: Take 3 tablets (7.5 mg) warfarin, Lovenox in the AM 5/28: Take 1 1/2 tablets (3.75 mg) warfarin, Lovenox in the AM 5/29: Take 1 tablet tablet (2.5 mg) warfarin, Lovenox in the AM 5/30: Take 2 tablets (5 mg) warfarin, Lovenox in the AM 5/31: Recheck INR at [facility]; no warfarin, no lovenox until INR check (Would like to recheck INR on 5/31 but coumadin clinic is at [facility] location on that day. Pt may prefer to go to [facility] which would have to be 5/29. This is normally not enough time for warfarin dosing to reach therapeutic INR. Will discuss with pt about traveling to [facility] clinic on 5/31 once provider reviews lovenox bridge suggestion)

## 2022-05-07 NOTE — Progress Notes (Signed)
05/07/2022 Tyler Atkinson [id number] January 12, 1956   HISTORY OF PRESENT ILLNESS: This is a pleasant 67 year old male who is a patient of Dr. Christella Hartigan known him for colonoscopy back in 2008.  At that time he only had internal hemorrhoids.  He has been sent back over here on this occasion to have another colonoscopy performed.  He does not have any GI complaints.  Moves his bowels well.  No sign of bleeding.  He is on Coumadin which he takes for history of upper extremity DVT.  Thisis prescribed by Dr. Caryl Never.   Past Medical History:  Diagnosis Date   Depression    DVT of upper extremity (deep vein thrombosis) 3/11   Hay fever    allergies   Hyperlipidemia    Hypothyroidism    Thyroid cancer    Past Surgical History:  Procedure Laterality Date   COLONOSCOPY     01/27/2006. Normal   THYROIDECTOMY     2000    reports that he quit smoking about 37 years ago. His smoking use included cigarettes and cigars. He has never used smokeless tobacco. He reports current alcohol use. He reports that he does not use drugs. family history includes Alzheimer's disease in his mother; Breast cancer in his paternal aunt and paternal grandmother; Colon cancer in his maternal uncle; Congestive Heart Failure in his father; Diabetes in his father; Hypertension in his mother; Prostate cancer in his maternal uncle; Skin cancer in his cousin; Stroke in his father. Allergies  Allergen Reactions   Ezetimibe-Simvastatin     REACTION: rash X 2-3 months      Outpatient Encounter Medications as of 05/07/2022  Medication Sig   desonide (DESOWEN) 0.05 % cream Apply topically 2 (two) times daily.   fexofenadine-pseudoephedrine (ALLEGRA-D) 60-120 MG per tablet Take 1 tablet by mouth daily as needed.   levothyroxine (SYNTHROID) 100 MCG tablet Take 1 tablet (100 mcg total) by mouth daily.   liothyronine (CYTOMEL) 25 MCG tablet TAKE 1/2 TABLET (12.5 MCG TOTAL) BY MOUTH DAILY.   rosuvastatin (CRESTOR) 5 MG tablet  Take 1 tablet (5 mg total) by mouth daily.   sertraline (ZOLOFT) 50 MG tablet Take 1 tablet (50 mg total) by mouth daily.   tadalafil (CIALIS) 20 MG tablet ONE BY MOUTH EVERY OTHER DAY AS NEEDED   traMADol (ULTRAM) 50 MG tablet TAKE 1 TABLET BY MOUTH EVERY 12 HOURS AS NEEDED   valsartan (DIOVAN) 160 MG tablet Take 1 tablet (160 mg total) by mouth daily.   warfarin (COUMADIN) 2.5 MG tablet TAKE 1 TABLET BY MOUTH DAILY, EXCEPT TAKE 2 TABLETS ON MONDAYS AND THURSDAYS. OR TAKE AS DIRECTED BY ANTICOAGULATION CLINIC. NEED APPT WITH PCP. (Patient taking differently: TAKE 1 TABLET BY MOUTH DAILY, EXCEPT TAKE 2 TABLETS ON MONDAYS AND THURSDAYS. OR TAKE AS DIRECTED BY ANTICOAGULATION CLINIC.)   No facility-administered encounter medications on file as of 05/07/2022.     REVIEW OF SYSTEMS  : All other systems reviewed and negative except where noted in the History of Present Illness.   PHYSICAL EXAM: BP (!) 140/80   Pulse 91   Ht 5' 10.5" (1.791 m)   Wt 177 lb (80.3 kg)   BMI 25.04 kg/m  General: Well developed white male in no acute distress Head: Normocephalic and atraumatic Eyes:  Sclerae anicteric, conjunctiva pink. Ears: Normal auditory acuity Lungs: Clear throughout to auscultation; no W/R/R. Heart: Regular rate and rhythm; no M/R/G. Rectal: Will be done at the time of colonoscopy. Musculoskeletal: Symmetrical with  no gross deformities  Skin: No lesions on visible extremities Extremities: No edema  Neurological: Alert oriented x 4, grossly non-focal Psychological:  Alert and cooperative. Normal mood and affect  ASSESSMENT AND PLAN: *Colon cancer screening: Last colonoscopy was in 2008 with Dr. Christella Hartigan with only hemorrhoids seen.  Will schedule with Dr. Leonides Schanz. *Chronic anticoagulation with Coumadin:  Will hold coumadin for 5 days prior to endoscopic procedures - will instruct when and how to resume after procedure. Benefits and risks of procedure explained including risks of bleeding,  perforation, infection, missed lesions, reactions to medications and possible need for hospitalization and surgery for complications. Additional rare but real risk of stroke or other vascular clotting events off of coumadin also explained and need to seek urgent help if any signs of these problems occur. Will communicate by phone or EMR with patient's  prescribing provider, Dr. Caryl Never, to confirm that holding coumadin is reasonable in this case.     CC:  Kristian Covey, MD

## 2022-05-07 NOTE — Telephone Encounter (Signed)
I agree with the Lovenox bridge. Call in 4 doses of Lovenox 120 mg to take daily prior to the procedure (see Shannon's directions)

## 2022-05-07 NOTE — Telephone Encounter (Signed)
Dr Caryl Never,  This patient is having a colonoscopy on 06-20-2022 with Dr Leonides Schanz and we are wondering if you are willing to approve him coming off coumadin 5 days prior to his colonoscopy. Please respond as soon as you can. Thank you      [facility] Pre-operative Risk Assessment     Request for surgical clearance:     Endoscopy Procedure  What type of surgery is being performed?     Colonoscopy  When is this surgery scheduled?     06-20-2022  What type of clearance is required ?   Pharmacy  Are there any medications that need to be held prior to surgery and how long? Coumadin 5 days prior  Practice name and name of physician performing surgery?      [facility]  What is your office phone and fax number?      Phone- [phone]  Fax- [phone]  Anesthesia type (None, local, MAC, general) ?       MAC

## 2022-05-08 NOTE — Telephone Encounter (Signed)
Advised pt of need for lovenox bridge and educated about lovenox and how it will help protect during warfarin hold. Advised pt there will be a written schedule for each day for instructions for warfarin and lovenox. Advised best to wait until 5/15, his next coumadin clinic apt, to go over instructions. Advised if anything changes before 5/15 to contact the coumadin clinic. Pt verbalized understanding.  Pt would like to wait on having the lovenox sent in until his apt on 5/15

## 2022-05-08 NOTE — Telephone Encounter (Signed)
Patient is returning your call.  

## 2022-05-08 NOTE — Telephone Encounter (Signed)
Noted  

## 2022-05-08 NOTE — Telephone Encounter (Signed)
Patient already aware.

## 2022-05-08 NOTE — Telephone Encounter (Signed)
LVM.  Will call in lovenox closer to pt's procedure. Pt has a coumadin clinic apt on 5/15 where he will be educated on lovenox bridge and when the lovenox prescription will be sent to the pharmacy.

## 2022-05-08 NOTE — Telephone Encounter (Signed)
LVM for patient to call back. ?

## 2022-05-08 NOTE — Progress Notes (Signed)
I agree with the assessment and plan as outlined by Ms. Zehr. 

## 2022-05-09 NOTE — Telephone Encounter (Signed)
Noted  

## 2022-05-30 ENCOUNTER — Other Ambulatory Visit: Payer: Self-pay | Admitting: Family Medicine

## 2022-06-10 NOTE — Patient Instructions (Signed)
Pre visit review using our clinic review tool, if applicable. No additional management support is needed unless otherwise documented below in the visit note.  Continue 1 tablet daily except take 2 tablets on Mondays and Thursdays until starting lovenox bridge instructions below.  5/19: Take last dose of warfarin 5/20: No warfarin, No Lovenox 5/21: No warfarin, Lovenox in the AM 5/22: No warfarin, Lovenox in the AM 5/23: No warfarin, Lovenox in the AM (before 7AM)   5/24: Colonoscopy; NO WARFARIN, NO LOVENOX   5/25: Take 1 1/2 tablets (3.75 mg) warfarin, Lovenox in the AM 5/26: Take 1 1/2 tablets (3.75 mg) warfarin, Lovenox in the AM 5/27: Take 3 tablets (7.5 mg) warfarin, Lovenox in the AM 5/28: Take 1 1/2 tablets (3.75 mg) warfarin, Lovenox in the AM 5/29: Take 1 tablet tablet (2.5 mg) warfarin, Lovenox in the AM 5/30: Take 2 tablets (5 mg) warfarin, Lovenox in the AM 5/31: Take 1 tablet (2.5 mg) warfarin, Lovenox in the AM 6/1: Take 1 tablet (2.5 mg) warfarin, No Lovenox injection 6/2: Take 1 tablet (2.5 mg) warfarin, No Lovenox injection 6/3: Recheck INR; no warfarin until after INR check

## 2022-06-11 ENCOUNTER — Ambulatory Visit (INDEPENDENT_AMBULATORY_CARE_PROVIDER_SITE_OTHER): Payer: Medicare Other

## 2022-06-11 DIAGNOSIS — Z7901 Long term (current) use of anticoagulants: Secondary | ICD-10-CM | POA: Diagnosis not present

## 2022-06-11 LAB — POCT INR: INR: 2.2 (ref 2.0–3.0)

## 2022-06-11 MED ORDER — ENOXAPARIN SODIUM 120 MG/0.8ML IJ SOSY
PREFILLED_SYRINGE | INTRAMUSCULAR | 0 refills | Status: DC
Start: 2022-06-11 — End: 2023-03-10

## 2022-06-11 NOTE — Progress Notes (Addendum)
Recommendation for Lovenox bridge for colonoscopy. Indication is DVT, CHADS-VASc score 2, HAS-BLED score 1.   Wt: 80 kg CrCl: 86.96 mL/min   Recommend Lovenox, 1 mg/kg, QD, (120 mg); pt will need 10 syringes. Current warfarin dosing is take 2.5 mg daily except take 5 mg on Mondays and Thursdays.   Lovenox Bridge;   Continue 1 tablet daily except take 2 tablets on Mondays and Thursdays until starting lovenox bridge instructions below.  5/19: Take last dose of warfarin 5/20: No warfarin, No Lovenox 5/21: No warfarin, Lovenox in the AM 5/22: No warfarin, Lovenox in the AM 5/23: No warfarin, Lovenox in the AM (before 7AM)   5/24: Colonoscopy; NO WARFARIN, NO LOVENOX   5/25: Take 1 1/2 tablets (3.75 mg) warfarin, Lovenox in the AM 5/26: Take 1 1/2 tablets (3.75 mg) warfarin, Lovenox in the AM 5/27: Take 3 tablets (7.5 mg) warfarin, Lovenox in the AM 5/28: Take 1 1/2 tablets (3.75 mg) warfarin, Lovenox in the AM 5/29: Take 1 tablet tablet (2.5 mg) warfarin, Lovenox in the AM 5/30: Take 2 tablets (5 mg) warfarin, Lovenox in the AM 5/31: Take 1 tablet (2.5 mg) warfarin, Lovenox in the AM 6/1: Take 1 tablet (2.5 mg) warfarin, No Lovenox injection 6/2: Take 1 tablet (2.5 mg) warfarin, No Lovenox injection 6/3: Take 2 tablets (5 mg) warfarin, No lovenox injection 6/4: Take 1 tablet (2.5 mg) warfarin, No lovenox injection 6/5: Recheck INR; no warfarin until after recheck Sent in lovenox prescription to pt's pharmacy of choice.

## 2022-06-11 NOTE — Addendum Note (Signed)
Addended by: Sherrie George A on: 06/11/2022 01:11 PM   Modules accepted: Orders

## 2022-06-16 ENCOUNTER — Encounter: Payer: Self-pay | Admitting: Certified Registered Nurse Anesthetist

## 2022-06-17 ENCOUNTER — Other Ambulatory Visit: Payer: Self-pay | Admitting: Family Medicine

## 2022-06-20 ENCOUNTER — Ambulatory Visit (AMBULATORY_SURGERY_CENTER): Payer: Medicare Other | Admitting: Internal Medicine

## 2022-06-20 ENCOUNTER — Encounter: Payer: Self-pay | Admitting: Internal Medicine

## 2022-06-20 VITALS — BP 122/77 | HR 78 | Temp 98.4°F | Resp 14 | Ht 70.5 in | Wt 177.0 lb

## 2022-06-20 DIAGNOSIS — D120 Benign neoplasm of cecum: Secondary | ICD-10-CM | POA: Diagnosis not present

## 2022-06-20 DIAGNOSIS — D123 Benign neoplasm of transverse colon: Secondary | ICD-10-CM

## 2022-06-20 DIAGNOSIS — D122 Benign neoplasm of ascending colon: Secondary | ICD-10-CM

## 2022-06-20 DIAGNOSIS — K5520 Angiodysplasia of colon without hemorrhage: Secondary | ICD-10-CM

## 2022-06-20 DIAGNOSIS — Z1211 Encounter for screening for malignant neoplasm of colon: Secondary | ICD-10-CM

## 2022-06-20 DIAGNOSIS — K635 Polyp of colon: Secondary | ICD-10-CM | POA: Diagnosis not present

## 2022-06-20 MED ORDER — SODIUM CHLORIDE 0.9 % IV SOLN
500.0000 mL | INTRAVENOUS | Status: DC
Start: 2022-06-20 — End: 2022-06-20

## 2022-06-20 NOTE — Progress Notes (Signed)
Vitals-DT  Pt's states no medical or surgical changes since previsit or office visit.  

## 2022-06-20 NOTE — Op Note (Signed)
[name] Procedure Date: 06/20/2022 9:05 AM MRN: [id number] Endoscopist: Particia Lather , , [id number] Age: 67 Referring MD:  Date of Birth: [birth year] Gender: Male Account #: [id number] Procedure:                Colonoscopy Indications:              Screening for colorectal malignant neoplasm Medicines:                Monitored Anesthesia Care Procedure:                Pre-Anesthesia Assessment:                           - Prior to the procedure, a History and Physical                            was performed, and patient medications and                            allergies were reviewed. The patient's tolerance of                            previous anesthesia was also reviewed. The risks                            and benefits of the procedure and the sedation                            options and risks were discussed with the patient.                            All questions were answered, and informed consent                            was obtained. Prior Anticoagulants: The patient has                            taken Coumadin (warfarin), last dose was 5 days                            prior to procedure. Last dose of Lovenox was 1 day                            prior to the procedure. ASA Grade Assessment: II -                            A patient with mild systemic disease. After                            reviewing the risks and benefits, the patient was                            deemed in satisfactory condition to undergo the  procedure.                           After obtaining informed consent, the colonoscope                            was passed under direct vision. Throughout the                            procedure, the patient's blood pressure, pulse, and                            oxygen saturations were monitored continuously. The                            CF [id number] #[id number] was introduced  through the anus                            and advanced to the the terminal ileum. The                            colonoscopy was performed without difficulty. The                            patient tolerated the procedure well. The quality                            of the bowel preparation was good. The terminal                            ileum, ileocecal valve, appendiceal orifice, and                            rectum were photographed. Scope In: 9:08:21 AM Scope Out: 9:31:08 AM Scope Withdrawal Time: 0 hours 19 minutes 27 seconds  Total Procedure Duration: 0 hours 22 minutes 47 seconds  Findings:                 The terminal ileum appeared normal.                           Three large localized angioectasias without                            bleeding were found in the cecum.                           13 sessile polyps were found in the transverse                            colon, ascending colon and cecum. The polyps were 3                            to 10 mm in size. These polyps were removed with a  cold snare. Resection and retrieval were complete.                           Non-bleeding internal hemorrhoids were found during                            retroflexion. Complications:            No immediate complications. Estimated Blood Loss:     Estimated blood loss was minimal. Impression:               - The examined portion of the ileum was normal.                           - Three non-bleeding colonic angioectasias.                           - 13 3 to 10 mm polyps in the transverse colon, in                            the ascending colon and in the cecum, removed with                            a cold snare. Resected and retrieved.                           - Non-bleeding internal hemorrhoids. Recommendation:           - Discharge patient to home (with escort).                           - Await pathology results.                           - Okay to  restart your warfarin tonight.                           - Okay to restart your Lovenox injections tomorrow                            or as directed by your anticoagulation clinic.                           - The findings and recommendations were discussed                            with the patient. Dr Particia Lather "Burr Ripper" Leonides Schanz,  06/20/2022 9:36:54 AM

## 2022-06-20 NOTE — Progress Notes (Signed)
Called to room to assist during endoscopic procedure.  Patient ID and intended procedure confirmed with present staff. Received instructions for my participation in the procedure from the performing physician.  

## 2022-06-20 NOTE — Progress Notes (Signed)
GASTROENTEROLOGY PROCEDURE H&P NOTE   Primary Care Physician: Kristian Covey, MD    Reason for Procedure:   Colon cancer screening  Plan:    Colonoscopy  Patient is appropriate for endoscopic procedure(s) in the ambulatory ([facility]) setting.  The nature of the procedure, as well as the risks, benefits, and alternatives were carefully and thoroughly reviewed with the patient. Ample time for discussion and questions allowed. The patient understood, was satisfied, and agreed to proceed.     HPI: Tyler Atkinson is a 67 y.o. male who presents for colonoscopy for evaluation of colon cancer screening .  Patient was most recently seen in the Gastroenterology Clinic on 05/07/22.  No interval change in medical history since that appointment. Please refer to that note for full details regarding GI history and clinical presentation.   Past Medical History:  Diagnosis Date   Depression    DVT of upper extremity (deep vein thrombosis) (HCC) 3/11   Hay fever    allergies   Hyperlipidemia    Hypothyroidism    Thyroid cancer [facility])     Past Surgical History:  Procedure Laterality Date   COLONOSCOPY     01/27/2006. Normal   THYROIDECTOMY     2000    Prior to Admission medications   Medication Sig Start Date End Date Taking? Authorizing Provider  desonide (DESOWEN) 0.05 % cream Apply topically 2 (two) times daily. 03/03/22  Yes Burchette, Elberta Fortis, MD  enoxaparin (LOVENOX) 120 MG/0.8ML injection INJECT 0.8 ML (120 MG) INTO THE SKIN AS DIRECTED BY ANTICOAGULATION CLINIC. 06/11/22  Yes Burchette, Elberta Fortis, MD  fexofenadine-pseudoephedrine (ALLEGRA-D) 60-120 MG per tablet Take 1 tablet by mouth daily as needed. 01/08/11  Yes Burchette, Elberta Fortis, MD  levothyroxine (SYNTHROID) 100 MCG tablet TAKE 1 TABLET BY MOUTH EVERY DAY 05/30/22  Yes Burchette, Elberta Fortis, MD  liothyronine (CYTOMEL) 25 MCG tablet TAKE 1/2 TABLET (12.5 MCG TOTAL) BY MOUTH DAILY. 03/03/22  Yes Burchette, Elberta Fortis, MD  rosuvastatin  (CRESTOR) 5 MG tablet Take 1 tablet (5 mg total) by mouth daily. 03/03/22  Yes Burchette, Elberta Fortis, MD  sertraline (ZOLOFT) 50 MG tablet Take 1 tablet (50 mg total) by mouth daily. 03/03/22  Yes Burchette, Elberta Fortis, MD  traMADol (ULTRAM) 50 MG tablet TAKE 1 TABLET BY MOUTH EVERY 12 HOURS AS NEEDED 06/18/22  Yes Nelwyn Salisbury, MD  valsartan (DIOVAN) 160 MG tablet Take 1 tablet (160 mg total) by mouth daily. 03/03/22  Yes Burchette, Elberta Fortis, MD  tadalafil (CIALIS) 20 MG tablet ONE BY MOUTH EVERY OTHER DAY AS NEEDED 11/29/12   Burchette, Elberta Fortis, MD  warfarin (COUMADIN) 2.5 MG tablet TAKE 1 TABLET BY MOUTH DAILY, EXCEPT TAKE 2 TABLETS ON MONDAYS AND THURSDAYS. OR TAKE AS DIRECTED BY ANTICOAGULATION CLINIC. NEED APPT WITH PCP. Patient taking differently: TAKE 1 TABLET BY MOUTH DAILY, EXCEPT TAKE 2 TABLETS ON MONDAYS AND THURSDAYS. OR TAKE AS DIRECTED BY ANTICOAGULATION CLINIC. 03/12/22   Burchette, Elberta Fortis, MD    Current Outpatient Medications  Medication Sig Dispense Refill   desonide (DESOWEN) 0.05 % cream Apply topically 2 (two) times daily. 60 g 2   enoxaparin (LOVENOX) 120 MG/0.8ML injection INJECT 0.8 ML (120 MG) INTO THE SKIN AS DIRECTED BY ANTICOAGULATION CLINIC. 8 mL 0   fexofenadine-pseudoephedrine (ALLEGRA-D) 60-120 MG per tablet Take 1 tablet by mouth daily as needed.     levothyroxine (SYNTHROID) 100 MCG tablet TAKE 1 TABLET BY MOUTH EVERY DAY 90 tablet 1  liothyronine (CYTOMEL) 25 MCG tablet TAKE 1/2 TABLET (12.5 MCG TOTAL) BY MOUTH DAILY. 45 tablet 3   rosuvastatin (CRESTOR) 5 MG tablet Take 1 tablet (5 mg total) by mouth daily. 90 tablet 3   sertraline (ZOLOFT) 50 MG tablet Take 1 tablet (50 mg total) by mouth daily. 90 tablet 3   traMADol (ULTRAM) 50 MG tablet TAKE 1 TABLET BY MOUTH EVERY 12 HOURS AS NEEDED 60 tablet 2   valsartan (DIOVAN) 160 MG tablet Take 1 tablet (160 mg total) by mouth daily. 90 tablet 3   tadalafil (CIALIS) 20 MG tablet ONE BY MOUTH EVERY OTHER DAY AS NEEDED 6 tablet 6    warfarin (COUMADIN) 2.5 MG tablet TAKE 1 TABLET BY MOUTH DAILY, EXCEPT TAKE 2 TABLETS ON MONDAYS AND THURSDAYS. OR TAKE AS DIRECTED BY ANTICOAGULATION CLINIC. NEED APPT WITH PCP. (Patient taking differently: TAKE 1 TABLET BY MOUTH DAILY, EXCEPT TAKE 2 TABLETS ON MONDAYS AND THURSDAYS. OR TAKE AS DIRECTED BY ANTICOAGULATION CLINIC.) 135 tablet 1   Current Facility-Administered Medications  Medication Dose Route Frequency Provider Last Rate Last Admin   0.9 %  sodium chloride infusion  500 mL Intravenous Continuous Imogene Burn, MD        Allergies as of 06/20/2022 - Review Complete 06/20/2022  Allergen Reaction Noted   Ezetimibe-simvastatin  07/11/2008    Family History  Problem Relation Age of Onset   Hypertension Mother    Alzheimer's disease Mother    Diabetes Father    Congestive Heart Failure Father    Stroke Father    Breast cancer Paternal Grandmother    Colon cancer Maternal Uncle    Prostate cancer Maternal Uncle    Breast cancer Paternal Aunt    Skin cancer Cousin        pat side    Social History   Socioeconomic History   Marital status: Married    Spouse name: Not on file   Number of children: 2   Years of education: Not on file   Highest education level: Not on file  Occupational History   Occupation: Retired Theatre stage manager  Tobacco Use   Smoking status: Former    Types: Cigarettes, Cigars    Quit date: 01/27/1985    Years since quitting: 37.4   Smokeless tobacco: Never  Vaping Use   Vaping Use: Never used  Substance and Sexual Activity   Alcohol use: Yes    Comment: beer daily 3 to 4 tops   Drug use: No   Sexual activity: Not on file  Other Topics Concern   Not on file  Social History Narrative   Not on file   Social Determinants of Health   Financial Resource Strain: Low Risk  (04/30/2022)   Overall Financial Resource Strain (CARDIA)    Difficulty of Paying Living Expenses: Not hard at all  Food Insecurity: No Food Insecurity (04/30/2022)    Hunger Vital Sign    Worried About Running Out of Food in the Last Year: Never true    Ran Out of Food in the Last Year: Never true  Transportation Needs: No Transportation Needs (04/30/2022)   PRAPARE - Administrator, Civil Service (Medical): No    Lack of Transportation (Non-Medical): No  Physical Activity: Inactive (04/30/2022)   Exercise Vital Sign    Days of Exercise per Week: 0 days    Minutes of Exercise per Session: 0 min  Stress: No Stress Concern Present (04/30/2022)   [employer] of Occupational Health -  Occupational Stress Questionnaire    Feeling of Stress : Not at all  Social Connections: Socially Integrated (04/30/2022)   Social Connection and Isolation Panel [NHANES]    Frequency of Communication with Friends and Family: More than three times a week    Frequency of Social Gatherings with Friends and Family: More than three times a week    Attends Religious Services: More than 4 times per year    Active Member of [employer] or Organizations: Yes    Attends Engineer, structural: More than 4 times per year    Marital Status: Married  Catering manager Violence: Not At Risk (04/30/2022)   Humiliation, Afraid, Rape, and Kick questionnaire    Fear of Current or Ex-Partner: No    Emotionally Abused: No    Physically Abused: No    Sexually Abused: No    Physical Exam: Vital signs in last 24 hours: BP 122/77 (BP Location: Right Arm, Patient Position: Sitting, Cuff Size: Normal)   Pulse 81   Temp 98.4 F (36.9 C)   Ht 5' 10.5" (1.791 m)   Wt 177 lb (80.3 kg)   SpO2 99%   BMI 25.04 kg/m  GEN: NAD EYE: Sclerae anicteric ENT: MMM CV: Non-tachycardic Pulm: No increased WOB GI: Soft NEURO:  Alert & Oriented   Eulah Pont, MD [place]  [facility]   06/20/2022 8:31 AM

## 2022-06-20 NOTE — Patient Instructions (Signed)
-  Restart Warfarin tonight  -Restart Lovenox injections tomorrow or as directed by your anticoagulation clinic -Handout on polyps, hemorrhoids provided -await pathology results -repeat colonoscopy for surveillance recommended. Date to be determined when pathology result become available   -Continue present medications    YOU HAD AN ENDOSCOPIC PROCEDURE TODAY AT THE [facility]:   Refer to the procedure report that was given to you for any specific questions about what was found during the examination.  If the procedure report does not answer your questions, please call your gastroenterologist to clarify.  If you requested that your care partner not be given the details of your procedure findings, then the procedure report has been included in a sealed envelope for you to review at your convenience later.  YOU SHOULD EXPECT: Some feelings of bloating in the abdomen. Passage of more gas than usual.  Walking can help get rid of the air that was put into your GI tract during the procedure and reduce the bloating. If you had a lower endoscopy (such as a colonoscopy or flexible sigmoidoscopy) you may notice spotting of blood in your stool or on the toilet paper. If you underwent a bowel prep for your procedure, you may not have a normal bowel movement for a few days.  Please Note:  You might notice some irritation and congestion in your nose or some drainage.  This is from the oxygen used during your procedure.  There is no need for concern and it should clear up in a day or so.  SYMPTOMS TO REPORT IMMEDIATELY:  Following lower endoscopy (colonoscopy or flexible sigmoidoscopy):  Excessive amounts of blood in the stool  Significant tenderness or worsening of abdominal pains  Swelling of the abdomen that is new, acute  Fever of 100F or higher   For urgent or emergent issues, a gastroenterologist can be reached at any hour by calling [phone]. Do not use MyChart messaging for urgent  concerns.    DIET:  We do recommend a small meal at first, but then you may proceed to your regular diet.  Drink plenty of fluids but you should avoid alcoholic beverages for 24 hours.  ACTIVITY:  You should plan to take it easy for the rest of today and you should NOT DRIVE or use heavy machinery until tomorrow (because of the sedation medicines used during the test).    FOLLOW UP: Our staff will call the number listed on your records the next business day following your procedure.  We will call around 7:15- 8:00 am to check on you and address any questions or concerns that you may have regarding the information given to you following your procedure. If we do not reach you, we will leave a message.     If any biopsies were taken you will be contacted by phone or by letter within the next 1-3 weeks.  Please call us at [phone] if you have not heard about the biopsies in 3 weeks.    SIGNATURES/CONFIDENTIALITY: You and/or your care partner have signed paperwork which will be entered into your electronic medical record.  These signatures attest to the fact that that the information above on your After Visit Summary has been reviewed and is understood.  Full responsibility of the confidentiality of this discharge information lies with you and/or your care-partner.

## 2022-06-20 NOTE — Progress Notes (Signed)
Report given to PACU, vss 

## 2022-06-24 ENCOUNTER — Telehealth: Payer: Self-pay | Admitting: *Deleted

## 2022-06-24 NOTE — Telephone Encounter (Signed)
  Follow up Call-     06/20/2022    8:10 AM 06/20/2022    8:03 AM  Call back number  Post procedure Call Back phone  # [phone]   Permission to leave phone message  Yes     Patient questions:  Do you have a fever, pain , or abdominal swelling? No. Pain Score  0 *  Have you tolerated food without any problems? Yes.    Have you been able to return to your normal activities? Yes.    Do you have any questions about your discharge instructions: Diet   No. Medications  No. Follow up visit  No.  Do you have questions or concerns about your Care? No.  Actions: * If pain score is 4 or above: No action needed, pain <4.

## 2022-06-25 ENCOUNTER — Encounter: Payer: Self-pay | Admitting: Internal Medicine

## 2022-07-02 ENCOUNTER — Ambulatory Visit (INDEPENDENT_AMBULATORY_CARE_PROVIDER_SITE_OTHER): Payer: Medicare Other

## 2022-07-02 DIAGNOSIS — Z7901 Long term (current) use of anticoagulants: Secondary | ICD-10-CM

## 2022-07-02 LAB — POCT INR: INR: 2.5 (ref 2.0–3.0)

## 2022-07-02 NOTE — Patient Instructions (Addendum)
Pre visit review using our clinic review tool, if applicable. No additional management support is needed unless otherwise documented below in the visit note.  Continue 1 tablet daily except take 2 tablets on Mondays and Thursdays. Recheck in 6 weeks.

## 2022-07-02 NOTE — Progress Notes (Signed)
Pt had colonoscopy on 5/24 and was placed on a lovenox bridge. Pt has stopped taking lovenox injections.  Continue 1 tablet daily except take 2 tablets on Mondays and Thursdays. Recheck in 6 weeks.

## 2022-08-13 ENCOUNTER — Ambulatory Visit: Payer: Medicare Other

## 2022-08-13 DIAGNOSIS — Z7901 Long term (current) use of anticoagulants: Secondary | ICD-10-CM

## 2022-08-13 LAB — POCT INR: INR: 2.4 (ref 2.0–3.0)

## 2022-08-13 NOTE — Patient Instructions (Addendum)
Pre visit review using our clinic review tool, if applicable. No additional management support is needed unless otherwise documented below in the visit note.  Continue 1 tablet daily except take 2 tablets on Mondays and Thursdays. Recheck in 6 weeks.

## 2022-08-13 NOTE — Progress Notes (Signed)
Continue 1 tablet daily except take 2 tablets on Mondays and Thursdays. Recheck in 6 weeks.

## 2022-08-27 ENCOUNTER — Other Ambulatory Visit: Payer: Self-pay | Admitting: Family Medicine

## 2022-08-27 ENCOUNTER — Encounter (INDEPENDENT_AMBULATORY_CARE_PROVIDER_SITE_OTHER): Payer: Self-pay

## 2022-09-06 ENCOUNTER — Other Ambulatory Visit: Payer: Self-pay | Admitting: Family Medicine

## 2022-09-06 DIAGNOSIS — Z7901 Long term (current) use of anticoagulants: Secondary | ICD-10-CM

## 2022-09-10 NOTE — Telephone Encounter (Signed)
Pt is compliant with warfarin management and PCP apts.  Sent in refill of warfarin to requested pharmacy.      

## 2022-09-24 ENCOUNTER — Ambulatory Visit (INDEPENDENT_AMBULATORY_CARE_PROVIDER_SITE_OTHER): Payer: Medicare Other

## 2022-09-24 DIAGNOSIS — Z7901 Long term (current) use of anticoagulants: Secondary | ICD-10-CM | POA: Diagnosis not present

## 2022-09-24 LAB — POCT INR: INR: 2.6 (ref 2.0–3.0)

## 2022-09-24 NOTE — Patient Instructions (Addendum)
Pre visit review using our clinic review tool, if applicable. No additional management support is needed unless otherwise documented below in the visit note.  Continue 1 tablet daily except take 2 tablets on Mondays and Thursdays. Recheck in 6 weeks.

## 2022-09-24 NOTE — Progress Notes (Signed)
Continue 1 tablet daily except take 2 tablets on Mondays and Thursdays. Recheck in 6 weeks.

## 2022-10-22 ENCOUNTER — Ambulatory Visit: Payer: Medicare Other

## 2022-10-22 ENCOUNTER — Encounter: Payer: Self-pay | Admitting: Family Medicine

## 2022-10-22 ENCOUNTER — Ambulatory Visit (INDEPENDENT_AMBULATORY_CARE_PROVIDER_SITE_OTHER): Payer: Medicare Other | Admitting: Family Medicine

## 2022-10-22 VITALS — BP 120/66 | HR 97 | Temp 97.9°F | Ht 70.5 in | Wt 178.3 lb

## 2022-10-22 DIAGNOSIS — R059 Cough, unspecified: Secondary | ICD-10-CM

## 2022-10-22 DIAGNOSIS — R079 Chest pain, unspecified: Secondary | ICD-10-CM | POA: Diagnosis not present

## 2022-10-22 DIAGNOSIS — E039 Hypothyroidism, unspecified: Secondary | ICD-10-CM | POA: Diagnosis not present

## 2022-10-22 DIAGNOSIS — R109 Unspecified abdominal pain: Secondary | ICD-10-CM | POA: Diagnosis not present

## 2022-10-22 LAB — POC URINALSYSI DIPSTICK (AUTOMATED)
Bilirubin, UA: NEGATIVE
Blood, UA: NEGATIVE
Glucose, UA: NEGATIVE
Ketones, UA: NEGATIVE
Leukocytes, UA: NEGATIVE
Nitrite, UA: NEGATIVE
Protein, UA: NEGATIVE
Spec Grav, UA: 1.015 (ref 1.010–1.025)
Urobilinogen, UA: 0.2 E.U./dL
pH, UA: 6.5 (ref 5.0–8.0)

## 2022-10-22 NOTE — Patient Instructions (Signed)
Set up lab for repeat thyroid check on Oct 9th at follow up.
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
## 2022-11-05 ENCOUNTER — Other Ambulatory Visit (INDEPENDENT_AMBULATORY_CARE_PROVIDER_SITE_OTHER): Payer: Medicare Other

## 2022-11-05 ENCOUNTER — Ambulatory Visit (INDEPENDENT_AMBULATORY_CARE_PROVIDER_SITE_OTHER): Payer: Medicare Other

## 2022-11-05 DIAGNOSIS — E039 Hypothyroidism, unspecified: Secondary | ICD-10-CM | POA: Diagnosis not present

## 2022-11-05 DIAGNOSIS — Z7901 Long term (current) use of anticoagulants: Secondary | ICD-10-CM

## 2022-11-05 LAB — POCT INR: INR: 3.3 — AB (ref 2.0–3.0)

## 2022-11-05 LAB — TSH: TSH: 1.02 u[IU]/mL (ref 0.35–5.50)

## 2022-11-05 NOTE — Progress Notes (Signed)
Pt reports increased stress and not eating as many vitamin K containing foods.  Pt already took warfarin today. Hold dose tomorrow and then continue 1 tablet daily except take 2 tablets on Mondays and Thursdays. Recheck in 6 weeks per pt request.

## 2022-11-05 NOTE — Patient Instructions (Addendum)
Pre visit review using our clinic review tool, if applicable. No additional management support is needed unless otherwise documented below in the visit note.  Hold dose tomorrow and then continue 1 tablet daily except take 2 tablets on Mondays and Thursdays. Recheck in 6 weeks.

## 2022-11-23 ENCOUNTER — Other Ambulatory Visit: Payer: Self-pay | Admitting: Family Medicine

## 2022-12-10 ENCOUNTER — Other Ambulatory Visit: Payer: Self-pay | Admitting: Family Medicine

## 2022-12-17 ENCOUNTER — Ambulatory Visit (INDEPENDENT_AMBULATORY_CARE_PROVIDER_SITE_OTHER): Payer: Medicare Other

## 2022-12-17 DIAGNOSIS — Z7901 Long term (current) use of anticoagulants: Secondary | ICD-10-CM

## 2022-12-17 LAB — POCT INR: INR: 1.9 — AB (ref 2.0–3.0)

## 2022-12-17 NOTE — Progress Notes (Signed)
Increase dose today to take 1 1/2 tablets and then continue 1 tablet daily except take 2 tablets on Mondays and Thursdays. Recheck in 4 weeks.

## 2022-12-17 NOTE — Patient Instructions (Addendum)
Pre visit review using our clinic review tool, if applicable. No additional management support is needed unless otherwise documented below in the visit note.  Increase dose today to take 1 1/2 tablets and then continue 1 tablet daily except take 2 tablets on Mondays and Thursdays. Recheck in 4 weeks.

## 2023-01-14 ENCOUNTER — Ambulatory Visit: Payer: Medicare Other

## 2023-01-14 DIAGNOSIS — Z7901 Long term (current) use of anticoagulants: Secondary | ICD-10-CM | POA: Diagnosis not present

## 2023-01-14 LAB — POCT INR: INR: 2.9 (ref 2.0–3.0)

## 2023-01-14 NOTE — Patient Instructions (Addendum)
Pre visit review using our clinic review tool, if applicable. No additional management support is needed unless otherwise documented below in the visit note.  Continue 1 tablet daily except take 2 tablets on Mondays and Thursdays. Recheck in 6 weeks.

## 2023-01-14 NOTE — Progress Notes (Signed)
Continue 1 tablet daily except take 2 tablets on Mondays and Thursdays. Recheck in 6 weeks.

## 2023-02-21 ENCOUNTER — Other Ambulatory Visit: Payer: Self-pay | Admitting: Family Medicine

## 2023-02-25 ENCOUNTER — Ambulatory Visit (INDEPENDENT_AMBULATORY_CARE_PROVIDER_SITE_OTHER): Payer: Medicare Other

## 2023-02-25 DIAGNOSIS — Z7901 Long term (current) use of anticoagulants: Secondary | ICD-10-CM | POA: Diagnosis not present

## 2023-02-25 LAB — POCT INR: INR: 2.3 (ref 2.0–3.0)

## 2023-02-25 NOTE — Progress Notes (Signed)
Continue 1 tablet daily except take 2 tablets on Mondays and Thursdays. Recheck in 6 weeks.

## 2023-02-25 NOTE — Patient Instructions (Addendum)
Pre visit review using our clinic review tool, if applicable. No additional management support is needed unless otherwise documented below in the visit note.  Continue 1 tablet daily except take 2 tablets on Mondays and Thursdays. Recheck in 6 weeks.

## 2023-03-10 ENCOUNTER — Encounter: Payer: Self-pay | Admitting: Family Medicine

## 2023-03-10 ENCOUNTER — Ambulatory Visit (INDEPENDENT_AMBULATORY_CARE_PROVIDER_SITE_OTHER): Payer: Medicare Other | Admitting: Family Medicine

## 2023-03-10 VITALS — BP 124/70 | HR 89 | Temp 98.3°F | Ht 70.6 in | Wt 176.8 lb

## 2023-03-10 DIAGNOSIS — E039 Hypothyroidism, unspecified: Secondary | ICD-10-CM

## 2023-03-10 DIAGNOSIS — E785 Hyperlipidemia, unspecified: Secondary | ICD-10-CM

## 2023-03-10 DIAGNOSIS — I10 Essential (primary) hypertension: Secondary | ICD-10-CM

## 2023-03-10 DIAGNOSIS — Z125 Encounter for screening for malignant neoplasm of prostate: Secondary | ICD-10-CM

## 2023-03-10 LAB — COMPREHENSIVE METABOLIC PANEL
ALT: 18 U/L (ref 0–53)
AST: 20 U/L (ref 0–37)
Albumin: 4.6 g/dL (ref 3.5–5.2)
Alkaline Phosphatase: 69 U/L (ref 39–117)
BUN: 15 mg/dL (ref 6–23)
CO2: 30 meq/L (ref 19–32)
Calcium: 8.9 mg/dL (ref 8.4–10.5)
Chloride: 101 meq/L (ref 96–112)
Creatinine, Ser: 0.87 mg/dL (ref 0.40–1.50)
GFR: 88.21 mL/min (ref >60.00)
Glucose, Bld: 94 mg/dL (ref 70–99)
Potassium: 4 meq/L (ref 3.5–5.1)
Sodium: 138 meq/L (ref 135–145)
Total Bilirubin: 0.6 mg/dL (ref 0.2–1.2)
Total Protein: 7.4 g/dL (ref 6.0–8.3)

## 2023-03-10 LAB — LIPID PANEL
Cholesterol: 188 mg/dL (ref 0–200)
HDL: 67.6 mg/dL (ref >39.00)
LDL Cholesterol: 102 mg/dL — ABNORMAL HIGH (ref 0–99)
NonHDL: 120.76
Total CHOL/HDL Ratio: 3
Triglycerides: 94 mg/dL (ref 0.0–149.0)
VLDL: 18.8 mg/dL (ref 0.0–40.0)

## 2023-03-10 LAB — PSA, MEDICARE: PSA: 0.54 ng/mL (ref 0.10–4.00)

## 2023-03-10 LAB — TSH: TSH: 3.75 u[IU]/mL (ref 0.35–5.50)

## 2023-03-10 NOTE — Progress Notes (Signed)
Established Patient Office Visit  Subjective   Patient ID: BURNICE OESTREICHER, male    DOB: March 28, 1955  Age: 68 y.o. MRN: [id number]  Chief Complaint  Patient presents with   Annual Exam    HPI   Tyler Atkinson is seen today today for medical follow-up.  He has history of remote DVT left lower extremity, hypertension, gout Tate psoriasis, history of depression, dyslipidemia, hypothyroidism.  He has been on Coumadin for several years.  His other medications include Diovan 160 mg daily, Cialis 20 mg every other day as needed, Zoloft 50 mg daily, levothyroxine 100 mcg daily, and Cytomel 25 mcg 1/2 tablet daily.  Compliant with medications.  Generally doing well.  Depression stable.  Is due for follow-up labs including lipids, thyroid, comprehensive chemistries.  He is also requesting repeat PSA.  Denies any recent chest pains.  Appetite and weight stable.  Blood pressures been well-controlled.  No dizziness.  Past Medical History:  Diagnosis Date   Depression    DVT of upper extremity (deep vein thrombosis) (HCC) 3/11   Hay fever    allergies   Hyperlipidemia    Hypothyroidism    Thyroid cancer [facility])    Past Surgical History:  Procedure Laterality Date   COLONOSCOPY     01/27/2006. Normal   THYROIDECTOMY     2000    reports that he quit smoking about 38 years ago. His smoking use included cigarettes and cigars. He has never used smokeless tobacco. He reports current alcohol use. He reports that he does not use drugs. family history includes Alzheimer's disease in his mother; Breast cancer in his paternal aunt and paternal grandmother; Colon cancer in his maternal uncle; Congestive Heart Failure in his father; Diabetes in his father; Hypertension in his mother; Prostate cancer in his maternal uncle; Skin cancer in his cousin; Stroke in his father. Allergies  Allergen Reactions   Ezetimibe-Simvastatin     REACTION: rash X 2-3 months    Review of Systems  Constitutional:  Negative for  malaise/fatigue.  Eyes:  Negative for blurred vision.  Respiratory:  Negative for shortness of breath.   Cardiovascular:  Negative for chest pain.  Gastrointestinal:  Negative for abdominal pain.  Neurological:  Negative for dizziness, weakness and headaches.      Objective:     BP 124/70 (BP Location: Left Arm, Patient Position: Sitting, Cuff Size: Large)   Pulse 89   Temp 98.3 F (36.8 C) (Oral)   Ht 5' 10.6" (1.793 m)   Wt 176 lb 12.8 oz (80.2 kg)   SpO2 97%   BMI 24.94 kg/m  BP Readings from Last 3 Encounters:  03/10/23 124/70  10/22/22 120/66  06/20/22 122/77   Wt Readings from Last 3 Encounters:  03/10/23 176 lb 12.8 oz (80.2 kg)  10/22/22 178 lb 4.8 oz (80.9 kg)  06/20/22 177 lb (80.3 kg)      Physical Exam Vitals reviewed.  Constitutional:      General: He is not in acute distress.    Appearance: He is well-developed.  HENT:     Right Ear: External ear normal.     Left Ear: External ear normal.  Eyes:     Pupils: Pupils are equal, round, and reactive to light.  Neck:     Thyroid: No thyromegaly.  Cardiovascular:     Rate and Rhythm: Normal rate and regular rhythm.  Pulmonary:     Effort: Pulmonary effort is normal. No respiratory distress.     Breath sounds:  Normal breath sounds. No wheezing or rales.  Musculoskeletal:     Cervical back: Neck supple.     Right lower leg: No edema.     Left lower leg: No edema.  Neurological:     Mental Status: He is alert and oriented to person, place, and time.      No results found for any visits on 03/10/23.    The 10-year ASCVD risk score (Arnett DK, et al., 2019) is: 16.3%    Assessment & Plan:   #1 past history of DVT left upper extremity.  On chronic Coumadin therapy.  Followed by Coumadin clinic.  #2 hypertension stable and at goal.  Continue Diovan 160 mg daily.  #3 hyperlipidemia treated with low-dose rosuvastatin.  Previous intolerance with simvastatin.  Recheck lipid and CMP  #4 history of  recurrent depression currently stable on sertraline 50 mg daily.  Continue current dosage  #5 hypothyroidism.  Patient on levothyroxine 100 mcg daily and low-dose Cytomel.  Recheck TSH  Health maintenance-immunizations up-to-date.  He thinks he has had RSV vaccine confirmed at pharmacy.  He does request follow-up PSA today and this will be drawn as well he states he will get colonoscopy later this year as he had 12 polyps removed at colonoscopy a year ago.  No follow-ups on file.    Evelena Peat, MD

## 2023-03-13 ENCOUNTER — Other Ambulatory Visit: Payer: Self-pay | Admitting: Family Medicine

## 2023-03-13 DIAGNOSIS — Z7901 Long term (current) use of anticoagulants: Secondary | ICD-10-CM

## 2023-03-13 NOTE — Telephone Encounter (Signed)
Pt is compliant with warfarin management and PCP apts.  Sent in refill of warfarin to requested pharmacy.

## 2023-03-15 ENCOUNTER — Other Ambulatory Visit: Payer: Self-pay | Admitting: Family Medicine

## 2023-03-15 DIAGNOSIS — E039 Hypothyroidism, unspecified: Secondary | ICD-10-CM

## 2023-04-08 ENCOUNTER — Ambulatory Visit (INDEPENDENT_AMBULATORY_CARE_PROVIDER_SITE_OTHER): Payer: Medicare Other

## 2023-04-08 DIAGNOSIS — Z7901 Long term (current) use of anticoagulants: Secondary | ICD-10-CM

## 2023-04-08 LAB — POCT INR: INR: 2.7 (ref 2.0–3.0)

## 2023-04-08 NOTE — Patient Instructions (Addendum)
 Pre visit review using our clinic review tool, if applicable. No additional management support is needed unless otherwise documented below in the visit note.  Continue 1 tablet daily except take 2 tablets on Mondays and Thursdays. Recheck in 6 weeks.

## 2023-04-08 NOTE — Progress Notes (Signed)
 Continue 1 tablet daily except take 2 tablets on Mondays and Thursdays. Recheck in 6 weeks.

## 2023-05-01 ENCOUNTER — Ambulatory Visit: Payer: Medicare Other

## 2023-05-01 VITALS — Ht 70.6 in | Wt 176.0 lb

## 2023-05-01 DIAGNOSIS — Z0000 Encounter for general adult medical examination without abnormal findings: Secondary | ICD-10-CM

## 2023-05-01 NOTE — Patient Instructions (Addendum)
 Mr. Raineri , Thank you for taking time to come for your Medicare Wellness Visit. I appreciate your ongoing commitment to your health goals. Please review the following plan we discussed and let me know if I can assist you in the future.   Referrals/Orders/Follow-Ups/Clinician Recommendations:   This is a list of the screening recommended for you and due dates:  Health Maintenance  Topic Date Due   COVID-19 Vaccine (5 - Moderna risk 2024-25 season) 06/04/2023   Flu Shot  08/28/2023   Medicare Annual Wellness Visit  04/30/2024   DTaP/Tdap/Td vaccine (3 - Td or Tdap) 01/22/2026   Colon Cancer Screening  06/19/2032   Pneumonia Vaccine  Completed   Hepatitis C Screening  Completed   Zoster (Shingles) Vaccine  Completed   HPV Vaccine  Aged Out    Advanced directives: (Declined) Advance directive discussed with you today. Even though you declined this today, please call our office should you change your mind, and we can give you the proper paperwork for you to fill out.  Next Medicare Annual Wellness Visit scheduled for next year: Yes

## 2023-05-01 NOTE — Progress Notes (Signed)
 Subjective:   Tyler Atkinson is a 68 y.o. who presents for a Medicare Wellness preventive visit.  Visit Complete: Virtual I connected with  Tyler Atkinson on 05/01/23 by a audio enabled telemedicine application and verified that I am speaking with the correct person using two identifiers.  Patient Location: Home  Provider Location: Home Office  I discussed the limitations of evaluation and management by telemedicine. The patient expressed understanding and agreed to proceed.  Vital Signs: Because this visit was a virtual/telehealth visit, some criteria may be missing or patient reported. Any vitals not documented were not able to be obtained and vitals that have been documented are patient reported.    Persons Participating in Visit: Patient.  AWV Questionnaire: No: Patient Medicare AWV questionnaire was not completed prior to this visit.  Cardiac Risk Factors include: advanced age (>81men, >16 women);male gender;hypertension;dyslipidemia     Objective:    Today's Vitals   05/01/23 0925  Weight: 176 lb (79.8 kg)  Height: 5' 10.6" (1.793 m)   Body mass index is 24.83 kg/m.     05/01/2023    9:32 AM 04/30/2022   10:27 AM 04/22/2021    9:52 AM  Advanced Directives  Does Patient Have a Medical Advance Directive? No No No  Would patient like information on creating a medical advance directive? No - Patient declined No - Patient declined     Current Medications (verified) Outpatient Encounter Medications as of 05/01/2023  Medication Sig   desonide (DESOWEN) 0.05 % cream Apply topically 2 (two) times daily.   fexofenadine-pseudoephedrine (ALLEGRA-D) 60-120 MG per tablet Take 1 tablet by mouth daily as needed.   levothyroxine (SYNTHROID) 100 MCG tablet TAKE 1 TABLET BY MOUTH EVERY DAY   liothyronine (CYTOMEL) 25 MCG tablet TAKE 1/2 TABLET (12.5 MCG TOTAL) BY MOUTH DAILY.   rosuvastatin (CRESTOR) 5 MG tablet TAKE 1 TABLET (5 MG TOTAL) BY MOUTH DAILY.   sertraline (ZOLOFT) 50 MG  tablet TAKE 1 TABLET BY MOUTH EVERY DAY   tadalafil (CIALIS) 20 MG tablet ONE BY MOUTH EVERY OTHER DAY AS NEEDED   traMADol (ULTRAM) 50 MG tablet Take 1 tablet (50 mg total) by mouth every 12 (twelve) hours as needed.   valsartan (DIOVAN) 160 MG tablet TAKE 1 TABLET BY MOUTH EVERY DAY   warfarin (COUMADIN) 2.5 MG tablet TAKE 1 TABLET  BY MOUTH DAILY EXCEPT TAKE 2 TABS ON MONDAYS AND THURSDAY OR AS DIRECTED BY ANTICOAGULATION CLINIC   No facility-administered encounter medications on file as of 05/01/2023.    Allergies (verified) Ezetimibe-simvastatin   History: Past Medical History:  Diagnosis Date   Depression    DVT of upper extremity (deep vein thrombosis) (HCC) 3/11   Hay fever    allergies   Hyperlipidemia    Hypothyroidism    Thyroid cancer [facility])    Past Surgical History:  Procedure Laterality Date   COLONOSCOPY     01/27/2006. Normal   THYROIDECTOMY     2000   Family History  Problem Relation Age of Onset   Hypertension Mother    Alzheimer's disease Mother    Diabetes Father    Congestive Heart Failure Father    Stroke Father    Breast cancer Paternal Grandmother    Colon cancer Maternal Uncle    Prostate cancer Maternal Uncle    Breast cancer Paternal Aunt    Skin cancer Cousin        pat side   Social History   Socioeconomic History  Marital status: Married    Spouse name: Not on file   Number of children: 2   Years of education: Not on file   Highest education level: Not on file  Occupational History   Occupation: Retired Theatre stage manager  Tobacco Use   Smoking status: Former    Current packs/day: 0.00    Types: Cigarettes, Cigars    Quit date: 01/27/1985    Years since quitting: 38.2   Smokeless tobacco: Never  Vaping Use   Vaping status: Never Used  Substance and Sexual Activity   Alcohol use: Yes    Comment: beer daily 3 to 4 tops   Drug use: No   Sexual activity: Not on file  Other Topics Concern   Not on file  Social History  Narrative   Not on file   Social Drivers of Health   Financial Resource Strain: Low Risk  (05/01/2023)   Overall Financial Resource Strain (CARDIA)    Difficulty of Paying Living Expenses: Not hard at all  Food Insecurity: No Food Insecurity (05/01/2023)   Hunger Vital Sign    Worried About Running Out of Food in the Last Year: Never true    Ran Out of Food in the Last Year: Never true  Transportation Needs: No Transportation Needs (05/01/2023)   PRAPARE - Administrator, Civil Service (Medical): No    Lack of Transportation (Non-Medical): No  Physical Activity: Sufficiently Active (05/01/2023)   Exercise Vital Sign    Days of Exercise per Week: 7 days    Minutes of Exercise per Session: 30 min  Stress: No Stress Concern Present (05/01/2023)   [employer] of Occupational Health - Occupational Stress Questionnaire    Feeling of Stress : Not at all  Social Connections: Socially Integrated (05/01/2023)   Social Connection and Isolation Panel [NHANES]    Frequency of Communication with Friends and Family: More than three times a week    Frequency of Social Gatherings with Friends and Family: More than three times a week    Attends Religious Services: More than 4 times per year    Active Member of [employer] or Organizations: Yes    Attends Engineer, structural: More than 4 times per year    Marital Status: Married    Tobacco Counseling Counseling given: Not Answered    Clinical Intake:  Pre-visit preparation completed: Yes  Pain : No/denies pain     BMI - recorded: 24.83 Nutritional Status: BMI of 19-24  Normal Nutritional Risks: None Diabetes: No  No results found for: "HGBA1C"   How often do you need to have someone help you when you read instructions, pamphlets, or other written materials from your doctor or pharmacy?: 1 - Never  Interpreter Needed?: No  Information entered by :: Theresa Mulligan LPN   Activities of Daily Living     05/01/2023     9:31 AM  In your present state of health, do you have any difficulty performing the following activities:  Hearing? 0  Vision? 0  Difficulty concentrating or making decisions? 0  Walking or climbing stairs? 0  Dressing or bathing? 0  Doing errands, shopping? 0  Preparing Food and eating ? N  Using the Toilet? N  In the past six months, have you accidently leaked urine? N  Do you have problems with loss of bowel control? N  Managing your Medications? N  Managing your Finances? N  Housekeeping or managing your Housekeeping? N    Patient Care  Team: Kristian Covey, MD as PCP - General (Family Medicine)  Indicate any recent Medical Services you may have received from other than [facility] providers in the past year (date may be approximate).     Assessment:   This is a routine wellness examination for Tyler Atkinson.  Hearing/Vision screen Hearing Screening - Comments:: Denies hearing difficulties   Vision Screening - Comments:: Wears rx glasses - up to date with routine eye exams with  [facility]   Goals Addressed               This Visit's Progress     Increase physical activity (pt-stated)        Stay Active and eat well.       Depression Screen     05/01/2023    9:29 AM 03/10/2023    9:26 AM 04/30/2022   10:24 AM 03/03/2022   10:33 AM 04/22/2021    9:54 AM 02/08/2019    1:19 PM 02/02/2018    9:00 AM  PHQ 2/9 Scores  PHQ - 2 Score 0 0 0 0 0 0 0  PHQ- 9 Score 0 0    0     Fall Risk     05/01/2023    9:31 AM 03/10/2023    9:26 AM 04/30/2022   10:26 AM 04/30/2022    7:24 AM 03/03/2022   10:34 AM  Fall Risk   Falls in the past year? 0 0 0 0 0  Number falls in past yr: 0 0 0 0 0  Injury with Fall? 0 0 0 0 0  Risk for fall due to : No Fall Risks No Fall Risks No Fall Risks  No Fall Risks  Follow up Falls prevention discussed;Falls evaluation completed Falls evaluation completed Falls prevention discussed  Falls evaluation completed    MEDICARE RISK AT HOME:  Medicare Risk at  Home Any stairs in or around the home?: Yes If so, are there any without handrails?: No Home free of loose throw rugs in walkways, pet beds, electrical cords, etc?: Yes Adequate lighting in your home to reduce risk of falls?: Yes Life alert?: No Use of a cane, walker or w/c?: No Grab bars in the bathroom?: No Shower chair or bench in shower?: Yes Elevated toilet seat or a handicapped toilet?: Yes  TIMED UP AND GO:  Was the test performed?  No  Cognitive Function: 6CIT completed        05/01/2023    9:34 AM 04/30/2022   10:27 AM 04/22/2021    9:55 AM  6CIT Screen  What Year? 0 points 0 points 0 points  What month? 0 points 0 points 0 points  What time? 0 points 0 points 0 points  Count back from 20 0 points 0 points 0 points  Months in reverse 0 points 0 points 0 points  Repeat phrase 0 points 0 points 2 points  Total Score 0 points 0 points 2 points    Immunizations Immunization History  Administered Date(s) Administered   Fluad Quad(high Dose 65+) 02-Jan-1956, 11/24/2018, 10/31/2019, 10/27/2021   Influenza Split 01/08/2011, 11/06/2011   Influenza Whole 12/05/2009   Influenza, High Dose Seasonal PF 12/05/2022   Influenza,inj,Quad PF,6+ Mos 11/11/2012, 01/17/2014, 11/09/2014, 10/31/2015, 12/17/2016, 11/18/2017   Influenza-Unspecified 11/13/2020   Moderna Covid-19 Fall Seasonal Vaccine 62yrs & older 12/05/2022   Moderna Covid-19 Vaccine Bivalent Booster 72yrs & up 11/13/2020   Moderna Sars-Covid-2 Vaccination 04/27/2019, 11/23/2019   Pneumococcal Conjugate-13 02/08/2019   Pneumococcal Polysaccharide-23 02/22/2020  Rsv, Bivalent, Protein Subunit Rsvpref,pf Verdis Frederickson) 12/05/2022   Td 01/28/2003   Tdap 01/23/2016   Zoster Recombinant(Shingrix) 06/29/2021, 10/17/2021   Zoster, Live 01/17/2014    Screening Tests Health Maintenance  Topic Date Due   COVID-19 Vaccine (5 - Moderna risk 2024-25 season) 06/04/2023   INFLUENZA VACCINE  08/28/2023   Medicare Annual Wellness  (AWV)  04/30/2024   DTaP/Tdap/Td (3 - Td or Tdap) 01/22/2026   Colonoscopy  06/19/2032   Pneumonia Vaccine 69+ Years old  Completed   Hepatitis C Screening  Completed   Zoster Vaccines- Shingrix  Completed   HPV VACCINES  Aged Out    Health Maintenance  There are no preventive care reminders to display for this patient.  Health Maintenance Items Addressed:    Additional Screening:  Vision Screening: Recommended annual ophthalmology exams for early detection of glaucoma and other disorders of the eye.  Dental Screening: Recommended annual dental exams for proper oral hygiene  Community Resource Referral / Chronic Care Management: CRR required this visit?  No   CCM required this visit?  No     Plan:     I have personally reviewed and noted the following in the patient's chart:   Medical and social history Use of alcohol, tobacco or illicit drugs  Current medications and supplements including opioid prescriptions. Patient is not currently taking opioid prescriptions. Functional ability and status Nutritional status Physical activity Advanced directives List of other physicians Hospitalizations, surgeries, and ER visits in previous 12 months Vitals Screenings to include cognitive, depression, and falls Referrals and appointments  In addition, I have reviewed and discussed with patient certain preventive protocols, quality metrics, and best practice recommendations. A written personalized care plan for preventive services as well as general preventive health recommendations were provided to patient.     Tillie Rung, LPN   4/0/9811   After Visit Summary: (MyChart) Due to this being a telephonic visit, the after visit summary with patients personalized plan was offered to patient via MyChart   Notes: Nothing significant to report at this time.

## 2023-05-20 ENCOUNTER — Ambulatory Visit (INDEPENDENT_AMBULATORY_CARE_PROVIDER_SITE_OTHER)

## 2023-05-20 DIAGNOSIS — Z7901 Long term (current) use of anticoagulants: Secondary | ICD-10-CM | POA: Diagnosis not present

## 2023-05-20 LAB — POCT INR: INR: 2.7 (ref 2.0–3.0)

## 2023-05-20 NOTE — Progress Notes (Signed)
 Continue 1 tablet daily except take 2 tablets on Mondays and Thursdays. Recheck in 6 weeks.

## 2023-05-20 NOTE — Patient Instructions (Addendum)
 Pre visit review using our clinic review tool, if applicable. No additional management support is needed unless otherwise documented below in the visit note.  Continue 1 tablet daily except take 2 tablets on Mondays and Thursdays. Recheck in 6 weeks.

## 2023-05-29 ENCOUNTER — Other Ambulatory Visit: Payer: Self-pay | Admitting: Family Medicine

## 2023-07-01 ENCOUNTER — Ambulatory Visit (INDEPENDENT_AMBULATORY_CARE_PROVIDER_SITE_OTHER)

## 2023-07-01 DIAGNOSIS — Z7901 Long term (current) use of anticoagulants: Secondary | ICD-10-CM

## 2023-07-01 LAB — POCT INR: INR: 2.5 (ref 2.0–3.0)

## 2023-07-01 NOTE — Progress Notes (Signed)
 Continue 1 tablet daily except take 2 tablets on Mondays and Thursdays. Recheck in 6 weeks.

## 2023-07-01 NOTE — Patient Instructions (Addendum)
 Pre visit review using our clinic review tool, if applicable. No additional management support is needed unless otherwise documented below in the visit note.  Continue 1 tablet daily except take 2 tablets on Mondays and Thursdays. Recheck in 6 weeks.

## 2023-08-12 ENCOUNTER — Ambulatory Visit

## 2023-08-12 DIAGNOSIS — Z7901 Long term (current) use of anticoagulants: Secondary | ICD-10-CM | POA: Diagnosis not present

## 2023-08-12 LAB — POCT INR: INR: 3.4 — AB (ref 2.0–3.0)

## 2023-08-12 NOTE — Progress Notes (Signed)
 Pt reports eating less greens. Advised to keep these foods consistent in his diet to help regulate warfarin. Pt verbalized understanding. Pt denies any other changes. Pt has been very stable on current dose. Supratherapeutic INR most likely due to dietary changes. Will hold warfarin tomorrow and pt will continue current dosing.  Pt already took warfarin today. Hold dose tomorrow and then continue 1 tablet daily except take 2 tablets on Mondays and Thursdays. Recheck in 3 weeks.

## 2023-08-12 NOTE — Patient Instructions (Addendum)
 Pre visit review using our clinic review tool, if applicable. No additional management support is needed unless otherwise documented below in the visit note.  Hold dose tomorrow and then continue 1 tablet daily except take 2 tablets on Mondays and Thursdays. Recheck in 3 weeks.

## 2023-08-15 ENCOUNTER — Other Ambulatory Visit: Payer: Self-pay | Admitting: Family Medicine

## 2023-09-02 ENCOUNTER — Ambulatory Visit

## 2023-09-02 DIAGNOSIS — Z7901 Long term (current) use of anticoagulants: Secondary | ICD-10-CM

## 2023-09-02 LAB — POCT INR: INR: 2.6 (ref 2.0–3.0)

## 2023-09-02 NOTE — Progress Notes (Signed)
 Continue 1 tablet daily except take 2 tablets on Mondays and Thursdays. Recheck in 6 weeks.

## 2023-09-02 NOTE — Patient Instructions (Addendum)
 Pre visit review using our clinic review tool, if applicable. No additional management support is needed unless otherwise documented below in the visit note.  Continue 1 tablet daily except take 2 tablets on Mondays and Thursdays. Recheck in 6 weeks.

## 2023-09-12 ENCOUNTER — Other Ambulatory Visit: Payer: Self-pay | Admitting: Family Medicine

## 2023-09-12 DIAGNOSIS — Z7901 Long term (current) use of anticoagulants: Secondary | ICD-10-CM

## 2023-10-14 ENCOUNTER — Ambulatory Visit

## 2023-10-14 DIAGNOSIS — Z7901 Long term (current) use of anticoagulants: Secondary | ICD-10-CM | POA: Diagnosis not present

## 2023-10-14 LAB — POCT INR: INR: 3.1 — AB (ref 2.0–3.0)

## 2023-10-14 NOTE — Patient Instructions (Addendum)
 Pre visit review using our clinic review tool, if applicable. No additional management support is needed unless otherwise documented below in the visit note.  Continue 1 tablet daily except take 2 tablets on Mondays and Thursdays. Recheck in 6 weeks.

## 2023-10-14 NOTE — Progress Notes (Signed)
 Pt denies any changes. Pt requested to eat a salad today instead of hold a 1/2 tablet. Advised to eat a salad and then continue normal dosing. Continue 1 tablet daily except take 2 tablets on Mondays and Thursdays. Recheck in 6 weeks per pt request.

## 2023-10-16 ENCOUNTER — Other Ambulatory Visit: Payer: Self-pay | Admitting: Family Medicine

## 2023-10-16 DIAGNOSIS — E039 Hypothyroidism, unspecified: Secondary | ICD-10-CM

## 2023-11-11 ENCOUNTER — Other Ambulatory Visit: Payer: Self-pay | Admitting: Family Medicine

## 2023-11-18 ENCOUNTER — Other Ambulatory Visit: Payer: Self-pay | Admitting: Family Medicine

## 2023-11-25 ENCOUNTER — Ambulatory Visit

## 2023-11-25 DIAGNOSIS — Z7901 Long term (current) use of anticoagulants: Secondary | ICD-10-CM | POA: Diagnosis not present

## 2023-11-25 DIAGNOSIS — Z23 Encounter for immunization: Secondary | ICD-10-CM | POA: Diagnosis not present

## 2023-11-25 LAB — POCT INR: INR: 2.1 (ref 2.0–3.0)

## 2023-11-25 NOTE — Patient Instructions (Addendum)
 Pre visit review using our clinic review tool, if applicable. No additional management support is needed unless otherwise documented below in the visit note.  Continue 1 tablet daily except take 2 tablets on Mondays and Thursdays. Recheck in 6 weeks.

## 2023-11-25 NOTE — Progress Notes (Signed)
 Continue 1 tablet daily except take 2 tablets on Mondays and Thursdays. Recheck in 6 weeks. Pt requested high dose flu vaccine. Administered vaccine. Pt tolerated well.

## 2024-01-06 ENCOUNTER — Ambulatory Visit (INDEPENDENT_AMBULATORY_CARE_PROVIDER_SITE_OTHER)

## 2024-01-06 DIAGNOSIS — Z7901 Long term (current) use of anticoagulants: Secondary | ICD-10-CM

## 2024-01-06 LAB — POCT INR: INR: 3.6 — AB (ref 2.0–3.0)

## 2024-01-06 NOTE — Patient Instructions (Addendum)
 Pre visit review using our clinic review tool, if applicable. No additional management support is needed unless otherwise documented below in the visit note.  Hold warfarin tomorrow and then continue 1 tablet daily except take 2 tablets on Mondays and Thursdays. Recheck in 4 weeks.

## 2024-01-06 NOTE — Progress Notes (Signed)
 Indication: DVT Pt reports reduced greens intake and also had alcohol 3 days ago after having a long period of no alcohol. Educated pt concerning interaction with alcohol. Pt denies any s/s of bleeding or abnormal bruising. Advised if any s/s to go to ER. Pt verbalized understanding. Due to known interaction and pt being stable on current dose will not make any changes to weekly dose.  Hold warfarin tomorrow and then continue 1 tablet daily except take 2 tablets on Mondays and Thursdays. Recheck in 4 weeks per pt request.

## 2024-02-03 ENCOUNTER — Ambulatory Visit

## 2024-02-03 DIAGNOSIS — Z7901 Long term (current) use of anticoagulants: Secondary | ICD-10-CM | POA: Diagnosis not present

## 2024-02-03 LAB — POCT INR: INR: 2.6 (ref 2.0–3.0)

## 2024-02-03 NOTE — Progress Notes (Signed)
 Indication: DVT Continue 1 tablet daily except take 2 tablets on Mondays and Thursdays. Recheck in 6 weeks.

## 2024-02-03 NOTE — Patient Instructions (Addendum)
 Pre visit review using our clinic review tool, if applicable. No additional management support is needed unless otherwise documented below in the visit note.  Continue 1 tablet daily except take 2 tablets on Mondays and Thursdays. Recheck in 6 weeks.

## 2024-02-07 ENCOUNTER — Other Ambulatory Visit: Payer: Self-pay | Admitting: Family Medicine

## 2024-02-21 ENCOUNTER — Other Ambulatory Visit: Payer: Self-pay | Admitting: Family Medicine

## 2024-03-14 ENCOUNTER — Ambulatory Visit

## 2024-03-14 ENCOUNTER — Encounter: Admitting: Family Medicine

## 2024-05-06 ENCOUNTER — Ambulatory Visit
